# Patient Record
Sex: Male | Born: 1995 | Race: Black or African American | Hispanic: No | Marital: Single | Smoking: Former smoker
Health system: Southern US, Community
[De-identification: ages and names within clinical notes are randomized; demographics above are authoritative.]

## PROBLEM LIST (undated history)

## (undated) DIAGNOSIS — J45909 Unspecified asthma, uncomplicated: Secondary | ICD-10-CM

## (undated) HISTORY — PX: ORBITAL FRACTURE SURGERY: SHX725

---
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
## 2018-03-03 ENCOUNTER — Ambulatory Visit
Admission: RE | Admit: 2018-03-03 | Discharge: 2018-03-03 | Disposition: A | Discharge status: Home / Self Care | Payer: Managed Care, Other (non HMO) | Source: Ambulatory Visit | Attending: Orthopedic Surgery | Admitting: Orthopedic Surgery

## 2018-03-03 ENCOUNTER — Ambulatory Visit: Payer: Managed Care, Other (non HMO) | Admitting: Anesthesiology

## 2018-03-03 ENCOUNTER — Encounter
Admission: RE | Disposition: A | Discharge status: Home / Self Care | Payer: Self-pay | Source: Ambulatory Visit | Attending: Orthopedic Surgery

## 2018-03-03 ENCOUNTER — Encounter: Payer: Self-pay | Admitting: Anesthesiology

## 2018-03-03 DIAGNOSIS — J45909 Unspecified asthma, uncomplicated: Secondary | ICD-10-CM | POA: Diagnosis not present

## 2018-03-03 DIAGNOSIS — Z880 Allergy status to penicillin: Secondary | ICD-10-CM | POA: Diagnosis not present

## 2018-03-03 DIAGNOSIS — S62614A Displaced fracture of proximal phalanx of right ring finger, initial encounter for closed fracture: Secondary | ICD-10-CM | POA: Diagnosis present

## 2018-03-03 DIAGNOSIS — W230XXA Caught, crushed, jammed, or pinched between moving objects, initial encounter: Secondary | ICD-10-CM | POA: Insufficient documentation

## 2018-03-03 DIAGNOSIS — Y998 Other external cause status: Secondary | ICD-10-CM | POA: Insufficient documentation

## 2018-03-03 DIAGNOSIS — Y93H2 Activity, gardening and landscaping: Secondary | ICD-10-CM | POA: Insufficient documentation

## 2018-03-03 DIAGNOSIS — Z91013 Allergy to seafood: Secondary | ICD-10-CM | POA: Insufficient documentation

## 2018-03-03 HISTORY — PX: OPEN REDUCTION INTERNAL FIXATION (ORIF) PROXIMAL PHALANX: SHX6235

## 2018-03-03 HISTORY — DX: Unspecified asthma, uncomplicated: J45.909

## 2018-03-03 SURGERY — OPEN REDUCTION INTERNAL FIXATION (ORIF) PROXIMAL PHALANX
Anesthesia: General | Site: Finger | Laterality: Right | Wound class: "Clean "

## 2018-03-03 MED ORDER — FENTANYL CITRATE (PF) 100 MCG/2ML IJ SOLN
INTRAMUSCULAR | Status: AC
  Filled 2018-03-03: qty 2

## 2018-03-03 MED ORDER — METOCLOPRAMIDE HCL 10 MG PO TABS: 5.0000 mg | ORAL_TABLET | Freq: Three times a day (TID) | ORAL | Status: DC | PRN

## 2018-03-03 MED ORDER — NEOMYCIN-POLYMYXIN B GU 40-200000 IR SOLN
Status: DC | PRN
  Administered 2018-03-03: 2 mL

## 2018-03-03 MED ORDER — HYDROCODONE-ACETAMINOPHEN 5-325 MG PO TABS: 1.0000 | ORAL_TABLET | Freq: Four times a day (QID) | ORAL | 0 refills | Status: AC | PRN

## 2018-03-03 MED ORDER — ACETAMINOPHEN 10 MG/ML IV SOLN
INTRAVENOUS | Status: DC | PRN
  Administered 2018-03-03: 1000 mg via INTRAVENOUS

## 2018-03-03 MED ORDER — ACETAMINOPHEN 10 MG/ML IV SOLN
INTRAVENOUS | Status: AC
  Filled 2018-03-03: qty 100

## 2018-03-03 MED ORDER — FENTANYL CITRATE (PF) 100 MCG/2ML IJ SOLN: 25.0000 ug | INTRAMUSCULAR | Status: DC | PRN

## 2018-03-03 MED ORDER — ONDANSETRON HCL 4 MG PO TABS: 4.0000 mg | ORAL_TABLET | Freq: Four times a day (QID) | ORAL | Status: DC | PRN

## 2018-03-03 MED ORDER — LIDOCAINE HCL (CARDIAC) PF 100 MG/5ML IV SOSY
PREFILLED_SYRINGE | INTRAVENOUS | Status: DC | PRN
  Administered 2018-03-03: 100 mg via INTRAVENOUS

## 2018-03-03 MED ORDER — METOCLOPRAMIDE HCL 5 MG/ML IJ SOLN: 5.0000 mg | Freq: Three times a day (TID) | INTRAMUSCULAR | Status: DC | PRN

## 2018-03-03 MED ORDER — MIDAZOLAM HCL 2 MG/2ML IJ SOLN
INTRAMUSCULAR | Status: DC | PRN
  Administered 2018-03-03: 2 mg via INTRAVENOUS

## 2018-03-03 MED ORDER — CLINDAMYCIN PHOSPHATE 600 MG/50ML IV SOLN
INTRAVENOUS | Status: AC
  Filled 2018-03-03: qty 50

## 2018-03-03 MED ORDER — ONDANSETRON HCL 4 MG/2ML IJ SOLN: 4.0000 mg | Freq: Four times a day (QID) | INTRAMUSCULAR | Status: DC | PRN

## 2018-03-03 MED ORDER — ONDANSETRON HCL 4 MG/2ML IJ SOLN
INTRAMUSCULAR | Status: DC | PRN
  Administered 2018-03-03: 4 mg via INTRAVENOUS

## 2018-03-03 MED ORDER — SODIUM CHLORIDE 0.9 % IV SOLN: INTRAVENOUS | Status: DC

## 2018-03-03 MED ORDER — PHENYLEPHRINE HCL 10 MG/ML IJ SOLN
INTRAMUSCULAR | Status: DC | PRN
  Administered 2018-03-03 (×3): 100 ug via INTRAVENOUS
  Administered 2018-03-03: 200 ug via INTRAVENOUS

## 2018-03-03 MED ORDER — HYDROCODONE-ACETAMINOPHEN 5-325 MG PO TABS: 1.0000 | ORAL_TABLET | ORAL | Status: DC | PRN

## 2018-03-03 MED ORDER — FENTANYL CITRATE (PF) 100 MCG/2ML IJ SOLN
INTRAMUSCULAR | Status: DC | PRN
  Administered 2018-03-03 (×3): 50 ug via INTRAVENOUS

## 2018-03-03 MED ORDER — ONDANSETRON HCL 4 MG/2ML IJ SOLN: 4.0000 mg | Freq: Once | INTRAMUSCULAR | Status: DC | PRN

## 2018-03-03 MED ORDER — MIDAZOLAM HCL 2 MG/2ML IJ SOLN
INTRAMUSCULAR | Status: AC
  Filled 2018-03-03: qty 2

## 2018-03-03 MED ORDER — PROPOFOL 10 MG/ML IV BOLUS
INTRAVENOUS | Status: DC | PRN
  Administered 2018-03-03: 200 mg via INTRAVENOUS

## 2018-03-03 MED ORDER — DEXAMETHASONE SODIUM PHOSPHATE 10 MG/ML IJ SOLN
INTRAMUSCULAR | Status: DC | PRN
  Administered 2018-03-03: 10 mg via INTRAVENOUS

## 2018-03-03 MED ORDER — CLINDAMYCIN PHOSPHATE 600 MG/50ML IV SOLN
600.0000 mg | Freq: Once | INTRAVENOUS | Status: AC
  Administered 2018-03-03: 600 mg via INTRAVENOUS

## 2018-03-03 MED ORDER — PROPOFOL 10 MG/ML IV BOLUS
INTRAVENOUS | Status: AC
  Filled 2018-03-03: qty 20

## 2018-03-03 MED ORDER — LACTATED RINGERS IV SOLN
INTRAVENOUS | Status: DC | PRN
  Administered 2018-03-03 (×2): via INTRAVENOUS

## 2018-03-03 SURGICAL SUPPLY — 42 items
BANDAGE ELASTIC 4 LF NS (GAUZE/BANDAGES/DRESSINGS) ×3 IMPLANT
BIT DRILL 1.1 (BIT) ×1
BIT DRILL 1.1MM (BIT) ×1
BIT DRILL 60X20X1.1XQC TMX (BIT) IMPLANT
BIT DRL 60X20X1.1XQC TMX (BIT) ×1
BNDG GAUZE 1X2.1 STRL (MISCELLANEOUS) ×3 IMPLANT
CHLORAPREP W/TINT 26ML (MISCELLANEOUS) ×3 IMPLANT
CUFF TOURN 18 STER (MISCELLANEOUS) IMPLANT
DRAPE FLUOR MINI C-ARM 54X84 (DRAPES) ×3 IMPLANT
ELECT CAUTERY BLADE 6.4 (BLADE) ×3 IMPLANT
ELECT REM PT RETURN 9FT ADLT (ELECTROSURGICAL) ×3
ELECTRODE REM PT RTRN 9FT ADLT (ELECTROSURGICAL) ×1 IMPLANT
GAUZE PETRO XEROFOAM 1X8 (MISCELLANEOUS) ×3 IMPLANT
GAUZE SPONGE 4X4 12PLY STRL (GAUZE/BANDAGES/DRESSINGS) ×3 IMPLANT
GLOVE SURG SYN 9.0  PF PI (GLOVE) ×2
GLOVE SURG SYN 9.0 PF PI (GLOVE) ×1 IMPLANT
GOWN SRG 2XL LVL 4 RGLN SLV (GOWNS) ×1 IMPLANT
GOWN STRL NON-REIN 2XL LVL4 (GOWNS) ×2
GOWN STRL REUS W/ TWL LRG LVL3 (GOWN DISPOSABLE) ×1 IMPLANT
GOWN STRL REUS W/TWL LRG LVL3 (GOWN DISPOSABLE) ×2
K-WIRE .045X6 DBL TRO NS (WIRE) ×3
KIT TURNOVER KIT A (KITS) ×3 IMPLANT
KWIRE .045X6 DBL TRO NS (WIRE) IMPLANT
LOCK SCREW 1.5X15MM (Screw) ×3 IMPLANT
NDL FILTER BLUNT 18X1 1/2 (NEEDLE) ×1 IMPLANT
NEEDLE FILTER BLUNT 18X 1/2SAF (NEEDLE) ×2
NEEDLE FILTER BLUNT 18X1 1/2 (NEEDLE) ×1 IMPLANT
NS IRRIG 500ML POUR BTL (IV SOLUTION) ×3 IMPLANT
PACK EXTREMITY ARMC (MISCELLANEOUS) ×3 IMPLANT
PAD PREP 24X41 OB/GYN DISP (PERSONAL CARE ITEMS) ×3 IMPLANT
PADDING CAST BLEND 4X4 NS (MISCELLANEOUS) ×3 IMPLANT
PLATE T SMALL 1.5MM (Plate) ×2 IMPLANT
SCREW 1.5X8 (Screw) ×2 IMPLANT
SCREW L 1.5X14 (Screw) ×2 IMPLANT
SCREW LOCK 1.5X15MM (Screw) IMPLANT
SCREW LOCKING 1.5X10 (Screw) ×4 IMPLANT
SCREW LOCKING 1.5X11MM (Screw) ×2 IMPLANT
SCREW NL 1.5X11 WRIST (Screw) ×2 IMPLANT
SCREW NL 1.5X12 (Screw) IMPLANT
SCREW NL 1.5X13 (Screw) IMPLANT
SUT ETHIBOND 4-0 (SUTURE) ×3 IMPLANT
SUT ETHILON 5 0 CL P 3 (SUTURE) ×3 IMPLANT

## 2018-03-03 NOTE — Anesthesia Post-op Follow-up Note (Signed)
Anesthesia QCDR form completed.        

## 2018-03-03 NOTE — Op Note (Signed)
03/03/2018  1:02 PM  PATIENT:  Cody Boone  22 y.o. male  PRE-OPERATIVE DIAGNOSIS:  closed displaced fracture of proximal phalanx of right ring finger  POST-OPERATIVE DIAGNOSIS:  closed displaced fracture of proximal phalanx of right ring finger  PROCEDURE:  Procedure(s): OPEN REDUCTION INTERNAL FIXATION (ORIF) PROXIMAL PHALANX-RIGHT RING FINGER (Right)  SURGEON: Leitha Schuller, MD  ASSISTANTS: None  ANESTHESIA:   general  EBL:  No intake/output data recorded.  BLOOD ADMINISTERED:none  DRAINS: none   LOCAL MEDICATIONS USED:  MARCAINE     SPECIMEN:  No Specimen  DISPOSITION OF SPECIMEN:  N/A  COUNTS:  YES  TOURNIQUET: 80 minutes at 250 mmHg  IMPLANTS: Biomet 1.5 mm small T plate with multiple screws  DICTATION: .Dragon Dictation patient brought the operating room and after adequate anesthesia was obtained the right arm was prepped and draped you sterile fashion after patient identification and timeout procedure were completed after having prepped and draped a tourniquet was raised.  Incision was made along the ileal  side of the proximal phalanx with care taken to try to preserve dorsal nerves as much as possible.  The lateral band was incised so that the proximal foot could be exposed the reduction was somewhat difficult secondary to comminution on the ulnar side.  After he was adequately aligned a K wire was used to provisionally hold it and then the T plate was applied with proximal and distal nonlocking screws to hold the plate against the bone.  Next locking screws were placed proximally distally as well as nonlocking screws and after screw holes were filled except for one at the level of the fracture the fracture appeared stable and essentially anatomic alignment with no rotatory deformity.  After this was completed the wound was thoroughly irrigated and 10 cc of half percent Sensorcaine were used for a digital block to aid in postop analgesia Xeroform 4 x 4's web roll  and a ulnar gutter splint with the MCP joints in flexion was then applied tourniquet let down at the close of the case  PLAN OF CARE: Discharge to home after PACU  PATIENT DISPOSITION:  PACU - hemodynamically stable.

## 2018-03-03 NOTE — Anesthesia Preprocedure Evaluation (Signed)
Anesthesia Evaluation  Patient identified by MRN, date of birth, ID band Patient awake    Reviewed: Allergy & Precautions, NPO status , Patient's Chart, lab work & pertinent test results, reviewed documented beta blocker date and time   Airway Mallampati: II  TM Distance: >3 FB     Dental  (+) Chipped   Pulmonary           Cardiovascular      Neuro/Psych    GI/Hepatic   Endo/Other    Renal/GU      Musculoskeletal   Abdominal   Peds  Hematology   Anesthesia Other Findings   Reproductive/Obstetrics                             Anesthesia Physical Anesthesia Plan  ASA: II  Anesthesia Plan: General   Post-op Pain Management:    Induction: Intravenous  PONV Risk Score and Plan:   Airway Management Planned: LMA  Additional Equipment:   Intra-op Plan:   Post-operative Plan:   Informed Consent: I have reviewed the patients History and Physical, chart, labs and discussed the procedure including the risks, benefits and alternatives for the proposed anesthesia with the patient or authorized representative who has indicated his/her understanding and acceptance.       Plan Discussed with: CRNA  Anesthesia Plan Comments:         Anesthesia Quick Evaluation  

## 2018-03-03 NOTE — Transfer of Care (Signed)
Immediate Anesthesia Transfer of Care Note  Patient: Cody Boone  Procedure(s) Performed: OPEN REDUCTION INTERNAL FIXATION (ORIF) PROXIMAL PHALANX-RIGHT RING FINGER (Right Finger)  Patient Location: PACU  Anesthesia Type:General  Level of Consciousness: sedated  Airway & Oxygen Therapy: Patient Spontanous Breathing and Patient connected to face mask oxygen  Post-op Assessment: Report given to RN and Post -op Vital signs reviewed and stable  Post vital signs: Reviewed and stable  Last Vitals:  Vitals Value Taken Time  BP    Temp    Pulse 72 03/03/2018  1:04 PM  Resp 17 03/03/2018  1:04 PM  SpO2 100 % 03/03/2018  1:04 PM  Vitals shown include unvalidated device data.  Last Pain:  Vitals:   03/03/18 0939  TempSrc: Temporal  PainSc: 0-No pain         Complications: No apparent anesthesia complications

## 2018-03-03 NOTE — Anesthesia Postprocedure Evaluation (Signed)
Anesthesia Post Note  Patient: Cody Boone  Procedure(s) Performed: OPEN REDUCTION INTERNAL FIXATION (ORIF) PROXIMAL PHALANX-RIGHT RING FINGER (Right Finger)  Patient location during evaluation: PACU Anesthesia Type: General Level of consciousness: awake and alert Pain management: pain level controlled Vital Signs Assessment: post-procedure vital signs reviewed and stable Respiratory status: spontaneous breathing, nonlabored ventilation, respiratory function stable and patient connected to nasal cannula oxygen Cardiovascular status: blood pressure returned to baseline and stable Postop Assessment: no apparent nausea or vomiting Anesthetic complications: no     Last Vitals:  Vitals:   03/03/18 1335 03/03/18 1349  BP: 121/88 (!) 131/93  Pulse: 81 80  Resp: 13 15  Temp: (!) 36.1 C 36.6 C  SpO2: 96% 99%    Last Pain:  Vitals:   03/03/18 1349  TempSrc: Temporal  PainSc: 0-No pain                 Kashlynn Kundert S

## 2018-03-03 NOTE — H&P (Signed)
Reviewed paper H+P, will be scanned into chart. No changes noted.  

## 2018-03-03 NOTE — Anesthesia Procedure Notes (Signed)
Procedure Name: LMA Insertion Date/Time: 03/03/2018 11:37 AM Performed by: Junious Silk, CRNA Pre-anesthesia Checklist: Patient identified, Patient being monitored, Timeout performed, Emergency Drugs available and Suction available Patient Re-evaluated:Patient Re-evaluated prior to induction Oxygen Delivery Method: Circle system utilized Preoxygenation: Pre-oxygenation with 100% oxygen Induction Type: IV induction Ventilation: Mask ventilation without difficulty LMA: LMA inserted Tube type: Oral Number of attempts: 1 Placement Confirmation: positive ETCO2 and breath sounds checked- equal and bilateral Tube secured with: Tape Dental Injury: Teeth and Oropharynx as per pre-operative assessment

## 2018-03-03 NOTE — Discharge Instructions (Addendum)
Arm elevated as much as possible through the weekend to minimize swelling.  Pain medicine as directed.  Ice to back of hand today and tomorrow  AMBULATORY SURGERY  DISCHARGE INSTRUCTIONS   1) The drugs that you were given will stay in your system until tomorrow so for the next 24 hours you should not:  A) Drive an automobile B) Make any legal decisions C) Drink any alcoholic beverage   2) You may resume regular meals tomorrow.  Today it is better to start with liquids and gradually work up to solid foods.  You may eat anything you prefer, but it is better to start with liquids, then soup and crackers, and gradually work up to solid foods.   3) Please notify your doctor immediately if you have any unusual bleeding, trouble breathing, redness and pain at the surgery site, drainage, fever, or pain not relieved by medication.    4) Additional Instructions:        Please contact your physician with any problems or Same Day Surgery at [phone], Monday through Friday 6 am to 4 pm, or [place] [facility] number at [phone].

## 2018-04-12 ENCOUNTER — Other Ambulatory Visit: Payer: Self-pay

## 2018-04-12 ENCOUNTER — Encounter: Payer: Self-pay | Admitting: Occupational Therapy

## 2018-04-12 ENCOUNTER — Ambulatory Visit: Payer: Managed Care, Other (non HMO) | Attending: Orthopedic Surgery | Admitting: Occupational Therapy

## 2018-04-12 DIAGNOSIS — M6281 Muscle weakness (generalized): Secondary | ICD-10-CM | POA: Diagnosis present

## 2018-04-12 DIAGNOSIS — M25641 Stiffness of right hand, not elsewhere classified: Secondary | ICD-10-CM | POA: Diagnosis present

## 2018-04-12 DIAGNOSIS — R278 Other lack of coordination: Secondary | ICD-10-CM | POA: Diagnosis present

## 2018-04-12 DIAGNOSIS — L905 Scar conditions and fibrosis of skin: Secondary | ICD-10-CM | POA: Diagnosis present

## 2018-04-15 NOTE — Therapy (Signed)
[place] [facility] AND [facility]TS MEDICINE [address], [place], [address] Phone: [phone]   Fax:  [phone]  Occupational Therapy Evaluation  Patient Details  Name: Cody Boone MRN: [id number] Date of Birth: 20-Jun-1996 Referring Provider: Rosita Kea   Encounter Date: 04/12/2018  OT End of Session - 04/15/18 1050    Visit Number  1    Number of Visits  12    Date for OT Re-Evaluation  05/24/18    OT Start Time  1500    OT Stop Time  1550    OT Time Calculation (min)  50 min    Activity Tolerance  Patient tolerated treatment well    Behavior During Therapy  [facility] for tasks assessed/performed       Past Medical History:  Diagnosis Date  . Asthma     Past Surgical History:  Procedure Laterality Date  . OPEN REDUCTION INTERNAL FIXATION (ORIF) PROXIMAL PHALANX Right 03/03/2018   Procedure: OPEN REDUCTION INTERNAL FIXATION (ORIF) PROXIMAL PHALANX-RIGHT RING FINGER;  Surgeon: Kennedy Bucker, MD;  Location: [facility] ORS;  Service: Orthopedics;  Laterality: Right;  . ORBITAL FRACTURE SURGERY Left     There were no vitals filed for this visit.  Subjective Assessment - 04/15/18 1049    Subjective   Patient denies any pain this date at evaluation.   He is a Archivist out for the summer.      Pertinent History  Pt is a 22 yo male who suffered an injury on 02/24/2018 when he was riding on a lawnmower that started to turn over, he jammed his right ring finger as he fell.  He was diagnosed with Closed displaced fracture of proximal phalanx of right ring finger is s/p OPEN REDUCTION INTERNAL FIXATION (ORIF) PROXIMAL PHALANX-RIGHT RING FINGER (Right) on 03/03/2018.      Limitations  no heavy lifting     Patient Stated Goals  Pt would like to use his hand more for everyday tasks.    Currently in Pain?  No/denies    Multiple Pain Sites  No        [facility] OT Assessment - 04/15/18 1055      Assessment   Medical Diagnosis  Closed displaced fracture  of proximal phalanx of right ring finger is s/p OPEN REDUCTION INTERNAL FIXATION (ORIF) PROXIMAL PHALANX-RIGHT RING FINGER (Right) on 03/03/2018.     Referring Provider  Rosita Kea    Hand Dominance  Left      Balance Screen   Has the patient fallen in the past 6 months  No    Has the patient had a decrease in activity level because of a fear of falling?   No    Is the patient reluctant to leave their home because of a fear of falling?   No      Home  Environment   Family/patient expects to be discharged to:  Private residence    Type of Home  House    Home Layout  One level    Lives With  Family      Prior Function   Level of Independence  Independent    Vocation  Student    Vocation Requirements  full time student at [employer]     Leisure  likes to hunt and fish      ADL   ADL comments  Patient is independent with all basic self care tasks, IADLs, driving.        Written Expression  Dominant Hand  Left      Sensation   Light Touch  Appears Intact    Stereognosis  Appears Intact    Hot/Cold  Appears Intact    Proprioception  Appears Intact      Coordination   Gross Motor Movements are Fluid and Coordinated  Yes    Fine Motor Movements are Fluid and Coordinated  Yes      Edema   Edema  edema present in right ring finger      AROM   Overall AROM Comments  BUE shoulder ROM WNL, elbow and wrist WNL      Strength   Overall Strength Comments  BUE shoulder, elbow and wrist strength 5/5       Right Hand AROM   R Index  MCP 0-90  90 Degrees    R Index PIP 0-100  95 Degrees    R Index DIP 0-70  75 Degrees    R Long  MCP 0-90  90 Degrees    R Long PIP 0-100  90 Degrees    R Long DIP 0-70  85 Degrees    R Ring  MCP 0-90  75 Degrees    R Ring PIP 0-100  75 Degrees -40 degrees extension    R Ring DIP 0-70  70 Degrees    R Little  MCP 0-90  65 Degrees    R Little PIP 0-100  90 Degrees    R Little DIP 0-70  75 Degrees      Right Hand PROM   R Index  MCP 0-90  90 Degrees    R  Index PIP 0-100  90 Degrees    R Index DIP 0-70  85 Degrees    R Long  MCP 0-90  85 Degrees    R Long PIP 0-100  90 Degrees    R Long DIP 0-70  85 Degrees    R Ring  MCP 0-90  90 Degrees    R Ring PIP 0-100  90 Degrees    R Ring DIP 0-70  80 Degrees    R Little  MCP 0-90  85 Degrees    R Little PIP 0-100  90 Degrees    R Little DIP 0-70  85 Degrees      Hand Function   Right Hand Grip (lbs)  60    Right Hand Lateral Pinch  18 lbs    Right Hand 3 Point Pinch  16 lbs    Left Hand Grip (lbs)  90    Left Hand Lateral Pinch  18 lbs    Left 3 point pinch  14 lbs        Right wrist flexion 80 degrees, extension 70. Fisting on right-partial fist at eval, 2 cm from ring finger from palm, .5 cm SF from palm.  He does not have a splint  Sensation intact Denies pain  Tx: Patient seen for contrast for edema control for 11 mins, alternating warm/cold Instructed on home program for ROM PIP extension ring finger right Tendon gliding exercises MP extension MP flexion Blocked DIP, PIP flexion All exercises 10 reps for 2-3 times a day                OT Education - 04/15/18 1050    Education Details  HEP, contrast for edema    Person(s) Educated  Patient    Methods  Explanation    Comprehension  Verbalized understanding;Returned demonstration  OT Long Term Goals - 04/15/18 1105      OT LONG TERM GOAL #1   Title  Patient will be independent with home exercise program for ROM, strength.     Baseline  none    Time  6    Period  Weeks    Status  New    Target Date  05/24/18      OT LONG TERM GOAL #2   Title  Patient will demonstrate understanding and implementation of edema control measures.     Baseline  none at eval    Time  3    Period  Weeks    Status  New    Target Date  05/03/18      OT LONG TERM GOAL #3   Title  Patient will demonstrate improved right ring finger extension for functional hand activities.     Baseline  -40 degrees extension  at eval    Time  6    Period  Weeks    Status  New    Target Date  05/24/18      OT LONG TERM GOAL #4   Title  Patient will demonstrate full fisting with fingers to palm and improved grip strength by 15# to pick up and grasp objects    Baseline  60# grip right, 90# left at eval, lacks full fist to palm at eval    Time  6    Period  Weeks    Status  New    Target Date  05/24/18            Plan - 04/15/18 1052    Clinical Impression Statement  Pt is a 22 yo male who suffered an injury on 02/24/2018 when he was riding on a lawnmower that started to turn over, he jammed his right ring finger as he fell.  He was diagnosed with Closed displaced fracture of proximal phalanx of right ring finger is s/p OPEN REDUCTION INTERNAL FIXATION (ORIF) PROXIMAL PHALANX-RIGHT RING FINGER (Right) on 03/03/2018.  He is left hand dominant and was referred to OT for evaluation and treatment.  Patient presents with right finger and hand muscle weakness, decreased ROM, lacks full extension of right ring PIP, edema, decreased grip strength and tenderness along scar line.  He would benefit from skilled OT to address above limitations and improve right hand use for necessary daily tasks.  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     Occupational performance deficits (Please refer to evaluation for details):  ADL's;IADL's;Education;Leisure    Rehab Potential  Excellent    Current Impairments/barriers affecting progress:  lack of extension right ring finger    OT Frequency  2x / week    OT Duration  6 weeks    OT Treatment/Interventions  Self-care/ADL training;Therapeutic exercise;Moist Heat;Paraffin;Neuromuscular education;Splinting;Patient/family education;Fluidtherapy;Scar mobilization;Therapeutic activities;Ultrasound;Cryotherapy;Contrast Bath;DME and/or AE instruction;Manual Therapy;Passive range of motion    Clinical Decision Making  Limited treatment options, no task modification  necessary    Consulted and Agree with Plan of Care  Patient       Patient will benefit from skilled therapeutic intervention in order to improve the following deficits and impairments:  Increased edema, Impaired flexibility, Decreased coordination, Decreased scar mobility, Decreased range of motion, Decreased strength, Impaired UE functional use  Visit Diagnosis: Stiffness of right hand, not elsewhere classified  Muscle weakness (generalized)  Scar condition and fibrosis of skin  Other lack of coordination  Problem List There are no active problems to display for this patient.  Amy T Arne ClevelandLovett, OTR/L, CLT  Lovett,Amy 04/15/2018, 11:10 AM  [facility] PH[facility] MEDICINE [address] [place], [place], [address] Phone: [phone]   Fax:  [phone]  Name: Cody Boone MRN: [id number] Date of Birth: 1996-07-16

## 2018-04-18 ENCOUNTER — Ambulatory Visit: Payer: Managed Care, Other (non HMO) | Admitting: Occupational Therapy

## 2018-04-18 DIAGNOSIS — M25641 Stiffness of right hand, not elsewhere classified: Secondary | ICD-10-CM

## 2018-04-18 DIAGNOSIS — R278 Other lack of coordination: Secondary | ICD-10-CM

## 2018-04-18 DIAGNOSIS — M6281 Muscle weakness (generalized): Secondary | ICD-10-CM

## 2018-04-18 DIAGNOSIS — L905 Scar conditions and fibrosis of skin: Secondary | ICD-10-CM

## 2018-04-18 NOTE — Therapy (Signed)
[facility]IC[facility] [address] [place], [place], [address] Phone: [phone]   Fax:  [phone]  Occupational Therapy Treatment  Patient Details  Name: Cody Boone K Kemp MRN: [id number] Date of Birth: 07-Nov-1995 Referring Provider: Rosita KeaMenz   Encounter Date: 04/18/2018  OT End of Session - 04/18/18 1120    Visit Number  2    Number of Visits  12    Date for OT Re-Evaluation  05/24/18    OT Start Time  1038    OT Stop Time  1115    OT Time Calculation (min)  37 min    Activity Tolerance  Patient tolerated treatment well    Behavior During Therapy  [facility] for tasks assessed/performed       Past Medical History:  Diagnosis Date  . Asthma     Past Surgical History:  Procedure Laterality Date  . OPEN REDUCTION INTERNAL FIXATION (ORIF) PROXIMAL PHALANX Right 03/03/2018   Procedure: OPEN REDUCTION INTERNAL FIXATION (ORIF) PROXIMAL PHALANX-RIGHT RING FINGER;  Surgeon: Kennedy BuckerMenz, Michael, MD;  Location: [facility] ORS;  Service: Orthopedics;  Laterality: Right;  . ORBITAL FRACTURE SURGERY Left     There were no vitals filed for this visit.  Subjective Assessment - 04/18/18 1117    Subjective   No pain - did my exercises about 3 x day     Patient Stated Goals  Pt would like to use his hand more for everyday tasks.    Currently in Pain?  No/denies         [facility] Assessment - 04/18/18 0001      Right Hand AROM   R Ring  MCP 0-90  85 Degrees    R Ring PIP 0-100  90 Degrees -45    R Ring DIP 0-70  65 Degrees       edema increase proximal phalanges 4th digit  7.3 cm R , L 5.8 and PIP increase 0.4 cm  Scar assess -still adhere on lateral digit  Pt ed on scar massage and mobs done by OT - using graston tool nr 2 for brushing and coban  Fitted with  Silicon digi sleeve for use at night time - and as needed during day   Ed on use of LMB splint for PIP extention of 4th  Precautions and pt verbalize and demo understanding          OT Treatments/Exercises (OP) - 04/18/18 0001      RUE Paraffin   Number Minutes Paraffin  8 Minutes    RUE Paraffin Location  Hand    Comments  LMB splint on for PIP extention on 4th  PIP         LMB splint on for 5-10 in slight stretch at home PROM for extention of PIP on table and during  PROM PIP extention with MP flex at 90 degrees  Rolling of putty 20 reps for PIP extention  - teal - NO GRIPPING Block MC - rubberband for extention of PIP - 10 reps( around 4th and 5th only    5 x day  Tendon glides - for flexion 3 x during day at least        OT Education - 04/18/18 1120    Education Details  update on HEP     Person(s) Educated  Patient    Methods  Explanation;Demonstration;Tactile cues    Comprehension  Returned demonstration;Verbal cues required;Verbalized understanding          OT  Long Term Goals - 04/15/18 1105      OT LONG TERM GOAL #1   Title  Patient will be independent with home exercise program for ROM, strength.     Baseline  none    Time  6    Period  Weeks    Status  New    Target Date  05/24/18      OT LONG TERM GOAL #2   Title  Patient will demonstrate understanding and implementation of edema control measures.     Baseline  none at eval    Time  3    Period  Weeks    Status  New    Target Date  05/03/18      OT LONG TERM GOAL #3   Title  Patient will demonstrate improved right ring finger extension for functional hand activities.     Baseline  -40 degrees extension at eval    Time  6    Period  Weeks    Status  New    Target Date  05/24/18      OT LONG TERM GOAL #4   Title  Patient will demonstrate full fisting with fingers to palm and improved grip strength by 15# to pick up and grasp objects    Baseline  60# grip right, 90# left at eval, lacks full fist to palm at eval    Time  6    Period  Weeks    Status  New    Target Date  05/24/18            Plan - 04/18/18 1120    Clinical Impression Statement  Pt  is 6 1/2 wk s/p ORIF proximal phalanges fx of R 4th - denies pain -and show increase flexion at PIP and MC - but extention of PIP still limited by -45 degrees- in clinic this date  improve to -30 degrees- update his HEP for PIP extention - and LMB splint use -     Occupational performance deficits (Please refer to evaluation for details):  ADL's;IADL's;Education;Leisure    Rehab Potential  Excellent    Current Impairments/barriers affecting progress:  lack of extension right ring finger    OT Frequency  2x / week    OT Duration  6 weeks    OT Treatment/Interventions  Self-care/ADL training;Therapeutic exercise;Moist Heat;Paraffin;Neuromuscular education;Splinting;Patient/family education;Fluidtherapy;Scar mobilization;Therapeutic activities;Ultrasound;Cryotherapy;Contrast Bath;DME and/or AE instruction;Manual Therapy;Passive range of motion    Clinical Decision Making  Limited treatment options, no task modification necessary    Consulted and Agree with Plan of Care  Patient       Patient will benefit from skilled therapeutic intervention in order to improve the following deficits and impairments:  Increased edema, Impaired flexibility, Decreased coordination, Decreased scar mobility, Decreased range of motion, Decreased strength, Impaired UE functional use  Visit Diagnosis: Stiffness of right hand, not elsewhere classified  Muscle weakness (generalized)  Scar condition and fibrosis of skin  Other lack of coordination    Problem List There are no active problems to display for this patient.   Oletta Cohn OTR/L,CLT 04/18/2018, 11:23 AM  [facility] PHYSICAL AND SPORTS MEDICINE [address], [place], [address] Phone: [phone]   Fax:  [phone]  Name: Cody Boone MRN: [id number] Date of Birth: 1996/09/11

## 2018-04-18 NOTE — Patient Instructions (Signed)
Heat  LMB splint on for 5-10 in slight stretch Scar massage  PROM for extention of PIP  Rolling of putty 20 reps for PIP extention  Block MC - rubberband for extention of PIP - 10 reps   5 x day  Tendon glides - for flexion 3 x during day at least   Silicon digi sleeve for night time - as needed during day for edema

## 2018-04-22 ENCOUNTER — Ambulatory Visit: Payer: Managed Care, Other (non HMO) | Admitting: Occupational Therapy

## 2018-04-22 DIAGNOSIS — M25641 Stiffness of right hand, not elsewhere classified: Secondary | ICD-10-CM | POA: Diagnosis not present

## 2018-04-22 DIAGNOSIS — M6281 Muscle weakness (generalized): Secondary | ICD-10-CM

## 2018-04-22 DIAGNOSIS — L905 Scar conditions and fibrosis of skin: Secondary | ICD-10-CM

## 2018-04-22 DIAGNOSIS — R278 Other lack of coordination: Secondary | ICD-10-CM

## 2018-04-22 NOTE — Therapy (Signed)
[facility] [address], [place], [address] Phone: [phone]   Fax:  [phone]  Occupational Therapy Treatment  Patient Details  Name: Cody Boone MRN: [id number] Date of Birth: Jul 04, 1996 Referring Provider: Rosita Kea   Encounter Date: 04/22/2018  OT End of Session - 04/22/18 1135    Visit Number  3    Number of Visits  12    Date for OT Re-Evaluation  05/24/18    OT Start Time  1124    OT Stop Time  1202    OT Time Calculation (min)  38 min    Activity Tolerance  Patient tolerated treatment well    Behavior During Therapy  [facility] for tasks assessed/performed       Past Medical History:  Diagnosis Date  . Asthma     Past Surgical History:  Procedure Laterality Date  . OPEN REDUCTION INTERNAL FIXATION (ORIF) PROXIMAL PHALANX Right 03/03/2018   Procedure: OPEN REDUCTION INTERNAL FIXATION (ORIF) PROXIMAL PHALANX-RIGHT RING FINGER;  Surgeon: Kennedy Bucker, MD;  Location: [facility] ORS;  Service: Orthopedics;  Laterality: Right;  . ORBITAL FRACTURE SURGERY Left     There were no vitals filed for this visit.  Subjective Assessment - 04/22/18 1134    Subjective   DId okay with the exercises -  I can feel my finger can stretch out little more- no increase pain     Patient Stated Goals  Pt would like to use his hand more for everyday tasks.    Currently in Pain?  No/denies          PIP of 4th -35 coming in  End of session -25 degrees          OT Treatments/Exercises (OP) - 04/22/18 0001      RUE Paraffin   Number Minutes Paraffin  8 Minutes    RUE Paraffin Location  Hand    Comments  LMB splint on while in paraffin to increase extention of PIP        edema increase proximal phalanges 4th digit  7.1 cm R , L 5.8 still - pt to pull compression sleeve down more Fitted with  Silicon digi sleeve for use at night time - and as needed during day  Scar assess -still adhere on lateral digit  But  improved from last time   scar massage and mobs done by OT - using graston tool nr 2 for brushing and coban  - and brushing done on volar 4th digit and palm  Gentle mobs and traction to PIP prior to ROM    LMB splint for PIP extention of 4th  On paraffin   LMB splint on for 5-10 in slight stretch at home PROM for extention of PIP on table and during  PROM PIP extention with MP flex at 90 degrees  - mproved  Rolling of putty 20 reps for PIP extention  - teal - NO GRIPPING Block MC - rubberband for extention of PIP - 10 reps( around 4th and 5th only  extention of PIP -35 this date coming in and end of session -25    5 x day  Tendon glides - for flexion 3 x during day at least        OT Education - 04/22/18 1135    Education Details  review again HEP    Person(s) Educated  Patient    Methods  Explanation;Demonstration    Comprehension  Verbalized understanding;Returned demonstration  OT Long Term Goals - 04/15/18 1105      OT LONG TERM GOAL #1   Title  Patient will be independent with home exercise program for ROM, strength.     Baseline  none    Time  6    Period  Weeks    Status  New    Target Date  05/24/18      OT LONG TERM GOAL #2   Title  Patient will demonstrate understanding and implementation of edema control measures.     Baseline  none at eval    Time  3    Period  Weeks    Status  New    Target Date  05/03/18      OT LONG TERM GOAL #3   Title  Patient will demonstrate improved right ring finger extension for functional hand activities.     Baseline  -40 degrees extension at eval    Time  6    Period  Weeks    Status  New    Target Date  05/24/18      OT LONG TERM GOAL #4   Title  Patient will demonstrate full fisting with fingers to palm and improved grip strength by 15# to pick up and grasp objects    Baseline  60# grip right, 90# left at eval, lacks full fist to palm at eval    Time  6    Period  Weeks    Status  New    Target Date   05/24/18            Plan - 04/22/18 1136    Clinical Impression Statement  Pt about 7 wks s/p ORIF prox phalanges fx of R 4th - no pain - pt showed increase PIP extention this date and MC flexoin -pt to cont with same HEP - focus on PIP extnetion     Occupational performance deficits (Please refer to evaluation for details):  ADL's;IADL's;Education;Leisure    Rehab Potential  Excellent    Current Impairments/barriers affecting progress:  lack of extension right ring finger    OT Frequency  2x / week    OT Duration  6 weeks    OT Treatment/Interventions  Self-care/ADL training;Therapeutic exercise;Moist Heat;Paraffin;Neuromuscular education;Splinting;Patient/family education;Fluidtherapy;Scar mobilization;Therapeutic activities;Ultrasound;Cryotherapy;Contrast Bath;DME and/or AE instruction;Manual Therapy;Passive range of motion    Plan  assess progress with HEP and upgrade HEP     Clinical Decision Making  Limited treatment options, no task modification necessary    OT Home Exercise Plan  see pt instruction     Consulted and Agree with Plan of Care  Patient       Patient will benefit from skilled therapeutic intervention in order to improve the following deficits and impairments:  Increased edema, Impaired flexibility, Decreased coordination, Decreased scar mobility, Decreased range of motion, Decreased strength, Impaired UE functional use  Visit Diagnosis: Stiffness of right hand, not elsewhere classified  Muscle weakness (generalized)  Scar condition and fibrosis of skin  Other lack of coordination    Problem List There are no active problems to display for this patient.   Oletta CohnuPreez, Malone Admire OTR/l,CLT 04/22/2018, 12:09 PM  [facility] [facility] [address] [place], [place], [address] Phone: [phone]   Fax:  [phone]  Name: Cody Boone MRN: [id number] Date of Birth: June 28, 1996

## 2018-04-22 NOTE — Patient Instructions (Addendum)
Same HEP  

## 2018-04-25 ENCOUNTER — Ambulatory Visit: Payer: Managed Care, Other (non HMO) | Admitting: Occupational Therapy

## 2018-04-25 DIAGNOSIS — R278 Other lack of coordination: Secondary | ICD-10-CM

## 2018-04-25 DIAGNOSIS — M25641 Stiffness of right hand, not elsewhere classified: Secondary | ICD-10-CM | POA: Diagnosis not present

## 2018-04-25 DIAGNOSIS — M6281 Muscle weakness (generalized): Secondary | ICD-10-CM

## 2018-04-25 DIAGNOSIS — L905 Scar conditions and fibrosis of skin: Secondary | ICD-10-CM

## 2018-04-25 NOTE — Patient Instructions (Signed)
Same HEP - add putty PIP extention - using teal

## 2018-04-25 NOTE — Therapy (Signed)
[facility] [address] [place], [address] Phone: [phone]   Fax:  [phone]  Occupational Therapy Treatment  Patient Details  Name: MATILDE MARKIE MRN: [id number] Date of Birth: 1996/05/22 Referring Provider: Rosita Kea   Encounter Date: 04/25/2018  OT End of Session - 04/25/18 1547    Visit Number  4    Number of Visits  12    Date for OT Re-Evaluation  05/24/18    OT Start Time  1513    OT Stop Time  1544    OT Time Calculation (min)  31 min    Activity Tolerance  Patient tolerated treatment well    Behavior During Therapy  [facility] for tasks assessed/performed       Past Medical History:  Diagnosis Date  . Asthma     Past Surgical History:  Procedure Laterality Date  . OPEN REDUCTION INTERNAL FIXATION (ORIF) PROXIMAL PHALANX Right 03/03/2018   Procedure: OPEN REDUCTION INTERNAL FIXATION (ORIF) PROXIMAL PHALANX-RIGHT RING FINGER;  Surgeon: Kennedy Bucker, MD;  Location: [facility] ORS;  Service: Orthopedics;  Laterality: Right;  . ORBITAL FRACTURE SURGERY Left     There were no vitals filed for this visit.  Subjective Assessment - 04/25/18 1515    Subjective   I can use my hand in everything - put on gloves, reaching in pocket - picking up objects -no really pain - I think my finger is getting more straigtht    Patient Stated Goals  Pt would like to use his hand more for everyday tasks.    Currently in Pain?  No/denies         [facility] OT Assessment - 04/25/18 0001      Strength   Right Hand Grip (lbs)  80    Left Hand Grip (lbs)  95      Right Hand AROM   R Ring  MCP 0-90  88 Degrees    R Ring PIP 0-100  95 Degrees -20 lat, dorsally -30               OT Treatments/Exercises (OP) - 04/25/18 0001      RUE Paraffin   Number Minutes Paraffin  8 Minutes    RUE Paraffin Location  Hand    Comments  LMB splint on for PIP extention of 4th prior to ROM , manual         edema increase  proximal phalanges 4th digit 7.0 cm R , L 5.8 still - pt to pull compression sleeve down more   Scar assess - great improvement in scar adhesion  on lateral digit    scar massage and mobs done by OT - using graston tool nr 2 for brushing and coban  - and brushing done on volar 4th digit and palm  Gentle mobs and traction to PIP prior to ROM    LMB splint for PIP extention of 4th  On paraffin    LMB splint on for 5-10 in slight stretchat home PROM for extention of PIPon table and during PROM PIP extention with MP flex at 90 degrees  - mproved  Rolling of putty 20 reps for PIP extention- teal - NO GRIPPING Block MC - rubberband for extention of PIP - 10 reps( around 4th and 5th only Teal putty - pushing into putty for PIP extention   5 x day  Tendon glides - for flexion 3 x during day at least Grip assess - grip on L  95 and R 80 lbs       OT Education - 04/25/18 1547    Education Details  PIP extention - into teal putty     Person(s) Educated  Patient    Methods  Demonstration    Comprehension  Verbalized understanding;Returned demonstration          OT Long Term Goals - 04/15/18 1105      OT LONG TERM GOAL #1   Title  Patient will be independent with home exercise program for ROM, strength.     Baseline  none    Time  6    Period  Weeks    Status  New    Target Date  05/24/18      OT LONG TERM GOAL #2   Title  Patient will demonstrate understanding and implementation of edema control measures.     Baseline  none at eval    Time  3    Period  Weeks    Status  New    Target Date  05/03/18      OT LONG TERM GOAL #3   Title  Patient will demonstrate improved right ring finger extension for functional hand activities.     Baseline  -40 degrees extension at eval    Time  6    Period  Weeks    Status  New    Target Date  05/24/18      OT LONG TERM GOAL #4   Title  Patient will demonstrate full fisting with fingers to palm and improved grip strength by  15# to pick up and grasp objects    Baseline  60# grip right, 90# left at eval, lacks full fist to palm at eval    Time  6    Period  Weeks    Status  New    Target Date  05/24/18            Plan - 04/25/18 1548    Clinical Impression Statement  Pt is now about 8 wks s/p ORIF prox phalanges fx R 4th - pt cont to show increase PIP extention - -20 on lateral side - and with edema -30 dorsal - show improvement of 5 degrees in session - pt to cont to increase extention     Occupational performance deficits (Please refer to evaluation for details):  ADL's;IADL's;Education;Leisure    Rehab Potential  Excellent    Current Impairments/barriers affecting progress:  lack of extension right ring finger    OT Frequency  2x / week    OT Duration  4 weeks    Plan  assess progress with HEP and upgrade HEP     Clinical Decision Making  Limited treatment options, no task modification necessary    OT Home Exercise Plan  see pt instruction     Consulted and Agree with Plan of Care  Patient       Patient will benefit from skilled therapeutic intervention in order to improve the following deficits and impairments:  Increased edema, Impaired flexibility, Decreased coordination, Decreased scar mobility, Decreased range of motion, Decreased strength, Impaired UE functional use  Visit Diagnosis: Stiffness of right hand, not elsewhere classified  Muscle weakness (generalized)  Scar condition and fibrosis of skin  Other lack of coordination    Problem List There are no active problems to display for this patient.   Oletta Cohn OTR/L,CLT 04/25/2018, 3:51 PM  [place] [facility] PHYSICAL AND SPORTS MEDICINE [address] [place], [place], [address] Phone: [phone]   Fax:  [phone]  Name: Mayford Knifeyson K Marlin MRN: [id number] Date of Birth: 08-18-1996

## 2018-04-28 ENCOUNTER — Ambulatory Visit: Payer: Managed Care, Other (non HMO) | Admitting: Occupational Therapy

## 2018-04-28 DIAGNOSIS — R278 Other lack of coordination: Secondary | ICD-10-CM

## 2018-04-28 DIAGNOSIS — M25641 Stiffness of right hand, not elsewhere classified: Secondary | ICD-10-CM

## 2018-04-28 DIAGNOSIS — M6281 Muscle weakness (generalized): Secondary | ICD-10-CM

## 2018-04-28 DIAGNOSIS — L905 Scar conditions and fibrosis of skin: Secondary | ICD-10-CM

## 2018-04-28 NOTE — Patient Instructions (Signed)
Add to HEP PIP extention gutter splint to wear at night time

## 2018-04-28 NOTE — Therapy (Signed)
[facility] [address] [place], [place], [address] Phone: [phone]   Fax:  [phone]  Occupational Therapy Treatment  Patient Details  Name: Cody Boone MRN: [id number] Date of Birth: [birth year] Referring Provider: Rosita KeaMenz   Encounter Date: 04/28/2018  OT End of Session - 04/28/18 0830    Visit Number  5    Number of Visits  12    Date for OT Re-Evaluation  05/24/18    OT Start Time  0757    OT Stop Time  0825    OT Time Calculation (min)  28 min    Activity Tolerance  Patient tolerated treatment well    Behavior During Therapy  [facility] for tasks assessed/performed       Past Medical History:  Diagnosis Date  . Asthma     Past Surgical History:  Procedure Laterality Date  . OPEN REDUCTION INTERNAL FIXATION (ORIF) PROXIMAL PHALANX Right 03/03/2018   Procedure: OPEN REDUCTION INTERNAL FIXATION (ORIF) PROXIMAL PHALANX-RIGHT RING FINGER;  Surgeon: Kennedy BuckerMenz, Michael, MD;  Location: [facility] ORS;  Service: Orthopedics;  Laterality: Right;  . ORBITAL FRACTURE SURGERY Left     There were no vitals filed for this visit.  Subjective Assessment - 04/28/18 0829    Subjective   Doing okay - I think it is still getting better my finger - more straight     Patient Stated Goals  Pt would like to use his hand more for everyday tasks.    Currently in Pain?  No/denies       Assess AROM of 4th PIP - -15 lat side and -28 on dorsal              OT Treatments/Exercises (OP) - 04/28/18 0001      RUE Paraffin   Number Minutes Paraffin  8 Minutes    RUE Paraffin Location  Hand    Comments  LMB splint on 4th  PIP to increase exte       Scar assess - great improvement in scar adhesion  on lateral digit  scar massage and mobs done by OT - using graston tool nr 2 for brushing and coban - and brushing done on volar 4th digit and palm  Gentle mobs and traction to PIP prior to ROM    LMB splint for PIP extention  of 4th On paraffin   PROM for PIP extention - in composite extention and lumbrical fist  AROM blocked PIP extention  Fabricate extention gutter splint for night time use- at 0 degrees of extention   pt ed on use and precautions - pt to do HEP for week and follow up      OT Education - 04/28/18 0830    Education Details  ed on use of PIP extention gutter splint for night time     Person(s) Educated  Patient    Methods  Demonstration    Comprehension  Verbalized understanding;Returned demonstration          OT Long Term Goals - 04/15/18 1105      OT LONG TERM GOAL #1   Title  Patient will be independent with home exercise program for ROM, strength.     Baseline  none    Time  6    Period  Weeks    Status  New    Target Date  05/24/18      OT LONG TERM GOAL #2   Title  Patient will demonstrate understanding and  implementation of edema control measures.     Baseline  none at eval    Time  3    Period  Weeks    Status  New    Target Date  05/03/18      OT LONG TERM GOAL #3   Title  Patient will demonstrate improved right ring finger extension for functional hand activities.     Baseline  -40 degrees extension at eval    Time  6    Period  Weeks    Status  New    Target Date  05/24/18      OT LONG TERM GOAL #4   Title  Patient will demonstrate full fisting with fingers to palm and improved grip strength by 15# to pick up and grasp objects    Baseline  60# grip right, 90# left at eval, lacks full fist to palm at eval    Time  6    Period  Weeks    Status  New    Target Date  05/24/18            Plan - 04/28/18 0831    Clinical Impression Statement  Pt making progress in scar tissue and AROM in PIP extention of 4th - but since last time on lat side -15 - add this date PIP extention splint to wear at night time - and cont wtih PROM , AROM and strengthening     Occupational performance deficits (Please refer to evaluation for details):   ADL's;IADL's;Education;Leisure    Rehab Potential  Excellent    Current Impairments/barriers affecting progress:  lack of extension right ring finger    OT Frequency  1x / week    OT Duration  4 weeks    OT Treatment/Interventions  Self-care/ADL training;Therapeutic exercise;Moist Heat;Paraffin;Neuromuscular education;Splinting;Patient/family education;Fluidtherapy;Scar mobilization;Therapeutic activities;Ultrasound;Cryotherapy;Contrast Bath;DME and/or AE instruction;Manual Therapy;Passive range of motion    Plan  assess progress with HEP and use of night extention gutter splint     Clinical Decision Making  Limited treatment options, no task modification necessary    OT Home Exercise Plan  see pt instruction     Consulted and Agree with Plan of Care  Patient       Patient will benefit from skilled therapeutic intervention in order to improve the following deficits and impairments:  Increased edema, Impaired flexibility, Decreased coordination, Decreased scar mobility, Decreased range of motion, Decreased strength, Impaired UE functional use  Visit Diagnosis: Stiffness of right hand, not elsewhere classified  Muscle weakness (generalized)  Scar condition and fibrosis of skin  Other lack of coordination    Problem List There are no active problems to display for this patient.   Oletta Cohn OTR/L,CLT 04/28/2018, 8:33 AM  [facility] PHYSICAL AND SPORTS MEDICINE [address] [place], [address] Phone: [phone]   Fax:  [phone]  Name: Cody Boone MRN: [id number] Date of Birth: November 10, 1995

## 2018-05-05 ENCOUNTER — Ambulatory Visit: Payer: Managed Care, Other (non HMO) | Attending: Orthopedic Surgery | Admitting: Occupational Therapy

## 2018-05-05 DIAGNOSIS — R278 Other lack of coordination: Secondary | ICD-10-CM | POA: Insufficient documentation

## 2018-05-05 DIAGNOSIS — L905 Scar conditions and fibrosis of skin: Secondary | ICD-10-CM | POA: Diagnosis present

## 2018-05-05 DIAGNOSIS — M6281 Muscle weakness (generalized): Secondary | ICD-10-CM | POA: Diagnosis present

## 2018-05-05 DIAGNOSIS — M25641 Stiffness of right hand, not elsewhere classified: Secondary | ICD-10-CM | POA: Diagnosis present
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
## 2018-05-05 NOTE — Patient Instructions (Signed)
  Same  Where quick cast for PIP extention most all the time - off 5 x day for maintain flexion in digits

## 2018-05-12 ENCOUNTER — Ambulatory Visit: Payer: Managed Care, Other (non HMO) | Admitting: Occupational Therapy

## 2018-05-12 DIAGNOSIS — M25641 Stiffness of right hand, not elsewhere classified: Secondary | ICD-10-CM

## 2018-05-12 DIAGNOSIS — M6281 Muscle weakness (generalized): Secondary | ICD-10-CM

## 2018-05-12 DIAGNOSIS — R278 Other lack of coordination: Secondary | ICD-10-CM

## 2018-05-12 DIAGNOSIS — L905 Scar conditions and fibrosis of skin: Secondary | ICD-10-CM

## 2018-05-12 NOTE — Therapy (Signed)
[facility] [facility] PHYSICAL AND SPORTS MEDICINE [address] [place], [address] Phone: [phone]   Fax:  [phone]  Occupational Therapy Treatment  Patient Details  Name: Cody Boone MRN: [id number] Date of Birth: 1995/12/28 Referring Provider: Rosita Kea   Encounter Date: 05/12/2018  OT End of Session - 05/12/18 1256    Visit Number  7    Number of Visits  12    Date for OT Re-Evaluation  05/24/18    OT Start Time  1245    OT Stop Time  1319    OT Time Calculation (min)  34 min    Activity Tolerance  Patient tolerated treatment well    Behavior During Therapy  [facility] for tasks assessed/performed       Past Medical History:  Diagnosis Date  . Asthma     Past Surgical History:  Procedure Laterality Date  . OPEN REDUCTION INTERNAL FIXATION (ORIF) PROXIMAL PHALANX Right 03/03/2018   Procedure: OPEN REDUCTION INTERNAL FIXATION (ORIF) PROXIMAL PHALANX-RIGHT RING FINGER;  Surgeon: Kennedy Bucker, MD;  Location: [facility] ORS;  Service: Orthopedics;  Laterality: Right;  . ORBITAL FRACTURE SURGERY Left     There were no vitals filed for this visit.  Subjective Assessment - 05/12/18 1252    Subjective   I can tell the cast got little looser - fell off one night off my finger - but I can tell my  finger is even more straight -     Patient Stated Goals  Pt would like to use his hand more for everyday tasks.    Currently in Pain?  No/denies         [facility] OT Assessment - 05/12/18 0001      Right Hand AROM   R Ring PIP 0-100  --   -10 lateral , dorsal -15              OT Treatments/Exercises (OP) - 05/12/18 0001      RUE Paraffin   Number Minutes Paraffin  8 Minutes    RUE Paraffin Location  Hand    Comments  LMB splint on during paraffin for PIP extention           Scar assess -great improvement in scar adhesionon lateral digit  scar massage and mobs done by OT - using graston tool nr 2 for brushingand vibration  Gentle  mobs and traction to PIP prior to ROM    LMB splint for PIP extention of 4th in  paraffin  PROM for PIP extention - in composite extention and lumbrical fist  AROM blocked PIP extention  Remade new   Quick cast for extention at neutral for PIP extention - to wear most all the time  - other one was loose   5 x off day for flexion  pt ed on use and precautions - pt to do HEP for about 10 days and follow up        OT Education - 05/12/18 1256    Education Details  HEP and splint wearing    Person(s) Educated  Patient    Methods  Explanation;Demonstration    Comprehension  Verbalized understanding;Returned demonstration          OT Long Term Goals - 05/12/18 1355      OT LONG TERM GOAL #1   Title  Patient will be independent with home exercise program for ROM, strength.     Status  Achieved      OT LONG  TERM GOAL #2   Title  Patient will demonstrate understanding and implementation of edema control measures.     Status  Achieved      OT LONG TERM GOAL #3   Title  Patient will demonstrate improved right ring finger extension for functional hand activities.     Status  Achieved      OT LONG TERM GOAL #4   Title  Patient will demonstrate full fisting with fingers to palm and improved grip strength by 15# to pick up and grasp objects    Status  Achieved            Plan - 05/12/18 1258    Clinical Impression Statement  Pt progress very well with use of serial cast for PIP extention to -10 degrees - edema decrease at PIP and maintain function and flexion at PIP - pt still to take cast of 5 x day for AROM     Occupational performance deficits (Please refer to evaluation for details):  ADL's;IADL's;Education;Leisure    Rehab Potential  Excellent    Current Impairments/barriers affecting progress:  lack of extension right ring finger    OT Frequency  1x / week    OT Treatment/Interventions  Self-care/ADL training;Therapeutic exercise;Moist Heat;Paraffin;Neuromuscular  education;Splinting;Patient/family education;Fluidtherapy;Scar mobilization;Therapeutic activities;Ultrasound;Cryotherapy;Contrast Bath;DME and/or AE instruction;Manual Therapy;Passive range of motion    Plan  pt to wear cast for another 10 days - at 0 degrees extention - possible d/c next time     Clinical Decision Making  Limited treatment options, no task modification necessary    OT Home Exercise Plan  see pt instruction     Consulted and Agree with Plan of Care  Patient       Patient will benefit from skilled therapeutic intervention in order to improve the following deficits and impairments:  Increased edema, Impaired flexibility, Decreased coordination, Decreased scar mobility, Decreased range of motion, Decreased strength, Impaired UE functional use  Visit Diagnosis: Stiffness of right hand, not elsewhere classified  Muscle weakness (generalized)  Scar condition and fibrosis of skin  Other lack of coordination    Problem List There are no active problems to display for this patient.   Oletta CohnuPreez, Telisha Zawadzki OTR/L,CLT 05/12/2018, 1:56 PM  [place] [facility] [address] [place], [place], [address] Phone: [phone]   Fax:  [phone]  Name: Cody Boone MRN: [id number] Date of Birth: Jul 21, 1996

## 2018-05-12 NOTE — Patient Instructions (Addendum)
Same HEP and readjust - serial cast refabricated at 0 degrees

## 2018-05-20 ENCOUNTER — Ambulatory Visit: Payer: Managed Care, Other (non HMO) | Admitting: Occupational Therapy

## 2018-05-20 DIAGNOSIS — M25641 Stiffness of right hand, not elsewhere classified: Secondary | ICD-10-CM | POA: Diagnosis not present

## 2018-05-20 DIAGNOSIS — R278 Other lack of coordination: Secondary | ICD-10-CM

## 2018-05-20 DIAGNOSIS — L905 Scar conditions and fibrosis of skin: Secondary | ICD-10-CM

## 2018-05-20 DIAGNOSIS — M6281 Muscle weakness (generalized): Secondary | ICD-10-CM

## 2018-05-20 NOTE — Patient Instructions (Signed)
Same HEP   PROM for PIP extention -  LMB spring splint on as needed  And PIP extention cast on for another 2 wks - and will follow up with me in month

## 2018-05-20 NOTE — Therapy (Signed)
[facility] [address] [place], [place], [address] Phone: [phone]   Fax:  [phone]  Occupational Therapy Treatment  Patient Details  Name: Cody Boone MRN: [id number] Date of Birth: [birth year] Referring Provider: Rosita KeaMenz   Encounter Date: 05/20/2018  OT End of Session - 05/20/18 1317    Visit Number  8    Number of Visits  12    Date for OT Re-Evaluation  05/24/18    OT Start Time  1245    OT Stop Time  1310    OT Time Calculation (min)  25 min    Activity Tolerance  Patient tolerated treatment well    Behavior During Therapy  [facility] for tasks assessed/performed       Past Medical History:  Diagnosis Date  . Asthma     Past Surgical History:  Procedure Laterality Date  . OPEN REDUCTION INTERNAL FIXATION (ORIF) PROXIMAL PHALANX Right 03/03/2018   Procedure: OPEN REDUCTION INTERNAL FIXATION (ORIF) PROXIMAL PHALANX-RIGHT RING FINGER;  Surgeon: Kennedy BuckerMenz, Michael, MD;  Location: [facility] ORS;  Service: Orthopedics;  Laterality: Right;  . ORBITAL FRACTURE SURGERY Left     There were no vitals filed for this visit.  Subjective Assessment - 05/20/18 1314    Subjective   Seen Dr Rosita KeaMenz past Monday - released me - hardware looks good and don't have to come out - doing okay with cast  little loose again - finger getting more straight -     Patient Stated Goals  Pt would like to use his hand more for everyday tasks.         [facility] OT Assessment - 05/20/18 0001      Right Hand AROM   R Ring PIP 0-100  --   -15 lateral , -20 dorsal  PIP ext      AROM assess at PIP for 4th - not as good as last time  And cast from last time loose         OT Treatments/Exercises (OP) - 05/20/18 0001      RUE Paraffin   Number Minutes Paraffin  8 Minutes    RUE Paraffin Location  Hand    Comments  LMB splint on for PIP extention stretch prior to cast  fabiricated         Scar assess -great improvement in scar  adhesionon lateral digit  scar massage and mobs done by OT - using graston tool nr 2 for brushingand vibration  Gentle mobs and traction to PIP prior to ROM    LMB splint for PIP extention of 4th inparaffin  PROM for PIP extention - in composite extention and lumbrical fist  AROM blocked PIP extention  Remade new Quick cast for extention at neutral for PIP extention - to wear most all the time  - other one was loose  5 x off day for flexion  pt ed on use and precautions -follow up in 4 wks         OT Education - 05/20/18 1317    Education Details  HEP and splint use     Person(s) Educated  Patient    Methods  Demonstration;Explanation    Comprehension  Returned demonstration;Verbalized understanding          OT Long Term Goals - 05/12/18 1355      OT LONG TERM GOAL #1   Title  Patient will be independent with home exercise program for ROM, strength.  Status  Achieved      OT LONG TERM GOAL #2   Title  Patient will demonstrate understanding and implementation of edema control measures.     Status  Achieved      OT LONG TERM GOAL #3   Title  Patient will demonstrate improved right ring finger extension for functional hand activities.     Status  Achieved      OT LONG TERM GOAL #4   Title  Patient will demonstrate full fisting with fingers to palm and improved grip strength by 15# to pick up and grasp objects    Status  Achieved            Plan - 05/20/18 1317    Clinical Impression Statement  Pt was progressing weekly - this date was not as good as last week - new PIP extention cast fabricated and pt to wear for another 2-4 wks and follow up for possible discharge - to cont with PROM end range PIP extention too     Occupational performance deficits (Please refer to evaluation for details):  ADL's;IADL's;Education;Leisure    Rehab Potential  Excellent    Current Impairments/barriers affecting progress:  lack of extension right ring finger    OT  Frequency  Monthly    OT Duration  4 weeks    OT Treatment/Interventions  Self-care/ADL training;Therapeutic exercise;Moist Heat;Paraffin;Neuromuscular education;Splinting;Patient/family education;Fluidtherapy;Scar mobilization;Therapeutic activities;Ultrasound;Cryotherapy;Contrast Bath;DME and/or AE instruction;Manual Therapy;Passive range of motion    Plan  pt to wear cast for another 2-4 wks  - at 0 degrees extention made-follow up in month     Clinical Decision Making  Limited treatment options, no task modification necessary    OT Home Exercise Plan  see pt instruction        Patient will benefit from skilled therapeutic intervention in order to improve the following deficits and impairments:  Increased edema, Impaired flexibility, Decreased coordination, Decreased scar mobility, Decreased range of motion, Decreased strength, Impaired UE functional use  Visit Diagnosis: Stiffness of right hand, not elsewhere classified  Muscle weakness (generalized)  Scar condition and fibrosis of skin  Other lack of coordination    Problem List There are no active problems to display for this patient.   Oletta CohnuPreez, Mistee Soliman OTR/L,CLT 05/20/2018, 2:10 PM  [place] [facility] [address] [place], [place], [address] Phone: [phone]   Fax:  [phone]  Name: Cody Knifeyson K Wool MRN: [id number] Date of Birth: 08/26/1996

## 2019-10-10 ENCOUNTER — Other Ambulatory Visit: Payer: Self-pay | Admitting: Sports Medicine

## 2019-10-10 DIAGNOSIS — M25461 Effusion, right knee: Secondary | ICD-10-CM

## 2019-10-10 DIAGNOSIS — S8991XA Unspecified injury of right lower leg, initial encounter: Secondary | ICD-10-CM

## 2019-10-17 ENCOUNTER — Ambulatory Visit
Admission: RE | Admit: 2019-10-17 | Discharge: 2019-10-17 | Disposition: A | Discharge status: Home / Self Care | Payer: Managed Care, Other (non HMO) | Source: Ambulatory Visit | Attending: Sports Medicine | Admitting: Sports Medicine

## 2019-10-17 ENCOUNTER — Other Ambulatory Visit: Payer: Self-pay

## 2019-10-17 DIAGNOSIS — M25461 Effusion, right knee: Secondary | ICD-10-CM | POA: Diagnosis present

## 2019-10-17 DIAGNOSIS — S8991XA Unspecified injury of right lower leg, initial encounter: Secondary | ICD-10-CM | POA: Diagnosis present

## 2019-10-20 ENCOUNTER — Other Ambulatory Visit: Payer: Self-pay | Admitting: Orthopedic Surgery

## 2019-10-20 DIAGNOSIS — U071 COVID-19: Secondary | ICD-10-CM

## 2019-10-20 HISTORY — DX: COVID-19: U07.1

## 2019-10-24 ENCOUNTER — Encounter: Admission: RE | Admit: 2019-10-24 | Payer: Managed Care, Other (non HMO) | Source: Ambulatory Visit

## 2019-10-25 ENCOUNTER — Other Ambulatory Visit: Payer: Managed Care, Other (non HMO)

## 2019-10-30 ENCOUNTER — Other Ambulatory Visit: Payer: Self-pay | Admitting: Orthopedic Surgery

## 2019-11-02 ENCOUNTER — Other Ambulatory Visit: Payer: Self-pay

## 2019-11-02 ENCOUNTER — Encounter: Payer: Self-pay | Admitting: Orthopedic Surgery

## 2019-11-03 ENCOUNTER — Other Ambulatory Visit: Admission: RE | Admit: 2019-11-03 | Payer: Managed Care, Other (non HMO) | Source: Ambulatory Visit

## 2019-11-08 ENCOUNTER — Other Ambulatory Visit: Admission: RE | Admit: 2019-11-08 | Payer: Managed Care, Other (non HMO) | Source: Ambulatory Visit

## 2019-11-08 NOTE — Anesthesia Preprocedure Evaluation (Addendum)
Anesthesia Evaluation  Patient identified by MRN, date of birth, ID band Patient awake    Reviewed: Allergy & Precautions, NPO status , Patient's Chart, lab work & pertinent test results  History of Anesthesia Complications Negative for: history of anesthetic complications  Airway Mallampati: II  TM Distance: >3 FB Neck ROM: Full    Dental   Pulmonary asthma , former smoker,   COVID+ 10/25/19   breath sounds clear to auscultation       Cardiovascular (-) angina(-) DOE  Rhythm:Regular Rate:Normal     Neuro/Psych    GI/Hepatic neg GERD  ,  Endo/Other    Renal/GU      Musculoskeletal   Abdominal   Peds  Hematology   Anesthesia Other Findings   Reproductive/Obstetrics                            Anesthesia Physical Anesthesia Plan  ASA: I  Anesthesia Plan: General   Post-op Pain Management:  Regional for Post-op pain   Induction: Intravenous  PONV Risk Score and Plan: 2 and Ondansetron, Treatment may vary due to age or medical condition and Midazolam  Airway Management Planned: LMA  Additional Equipment:   Intra-op Plan:   Post-operative Plan: Extubation in OR  Informed Consent: I have reviewed the patients History and Physical, chart, labs and discussed the procedure including the risks, benefits and alternatives for the proposed anesthesia with the patient or authorized representative who has indicated his/her understanding and acceptance.       Plan Discussed with: CRNA and Anesthesiologist  Anesthesia Plan Comments:        Anesthesia Quick Evaluation

## 2019-11-10 ENCOUNTER — Encounter
Admission: RE | Disposition: A | Discharge status: Home / Self Care | Payer: Self-pay | Source: Home / Self Care | Attending: Orthopedic Surgery

## 2019-11-10 ENCOUNTER — Encounter: Payer: Self-pay | Admitting: Orthopedic Surgery

## 2019-11-10 ENCOUNTER — Ambulatory Visit: Payer: Managed Care, Other (non HMO) | Admitting: Anesthesiology

## 2019-11-10 ENCOUNTER — Other Ambulatory Visit: Payer: Self-pay

## 2019-11-10 ENCOUNTER — Ambulatory Visit
Admission: RE | Admit: 2019-11-10 | Discharge: 2019-11-10 | Disposition: A | Discharge status: Home / Self Care | Payer: Managed Care, Other (non HMO) | Attending: Orthopedic Surgery | Admitting: Orthopedic Surgery

## 2019-11-10 DIAGNOSIS — Z79899 Other long term (current) drug therapy: Secondary | ICD-10-CM | POA: Diagnosis not present

## 2019-11-10 DIAGNOSIS — X509XXA Other and unspecified overexertion or strenuous movements or postures, initial encounter: Secondary | ICD-10-CM | POA: Insufficient documentation

## 2019-11-10 DIAGNOSIS — Z91010 Allergy to peanuts: Secondary | ICD-10-CM | POA: Insufficient documentation

## 2019-11-10 DIAGNOSIS — Z87891 Personal history of nicotine dependence: Secondary | ICD-10-CM | POA: Insufficient documentation

## 2019-11-10 DIAGNOSIS — Z880 Allergy status to penicillin: Secondary | ICD-10-CM | POA: Diagnosis not present

## 2019-11-10 DIAGNOSIS — Z8489 Family history of other specified conditions: Secondary | ICD-10-CM | POA: Diagnosis not present

## 2019-11-10 DIAGNOSIS — J45909 Unspecified asthma, uncomplicated: Secondary | ICD-10-CM | POA: Diagnosis not present

## 2019-11-10 DIAGNOSIS — S83511A Sprain of anterior cruciate ligament of right knee, initial encounter: Secondary | ICD-10-CM | POA: Diagnosis present

## 2019-11-10 DIAGNOSIS — Z833 Family history of diabetes mellitus: Secondary | ICD-10-CM | POA: Insufficient documentation

## 2019-11-10 DIAGNOSIS — X500XXA Overexertion from strenuous movement or load, initial encounter: Secondary | ICD-10-CM | POA: Insufficient documentation

## 2019-11-10 DIAGNOSIS — S83281A Other tear of lateral meniscus, current injury, right knee, initial encounter: Secondary | ICD-10-CM | POA: Insufficient documentation

## 2019-11-10 DIAGNOSIS — Z91013 Allergy to seafood: Secondary | ICD-10-CM | POA: Insufficient documentation

## 2019-11-10 DIAGNOSIS — Y9389 Activity, other specified: Secondary | ICD-10-CM | POA: Diagnosis not present

## 2019-11-10 HISTORY — PX: KNEE ARTHROSCOPY WITH ANTERIOR CRUCIATE LIGAMENT (ACL) REPAIR: SHX5644

## 2019-11-10 SURGERY — KNEE ARTHROSCOPY WITH ANTERIOR CRUCIATE LIGAMENT (ACL) REPAIR
Anesthesia: General | Site: Knee | Laterality: Right

## 2019-11-10 MED ORDER — CHLORHEXIDINE GLUCONATE 4 % EX LIQD: 60.0000 mL | Freq: Once | CUTANEOUS | Status: DC

## 2019-11-10 MED ORDER — ONDANSETRON 4 MG PO TBDP: 4.0000 mg | ORAL_TABLET | Freq: Three times a day (TID) | ORAL | 0 refills | Status: AC | PRN

## 2019-11-10 MED ORDER — MEPERIDINE HCL 25 MG/ML IJ SOLN: 6.2500 mg | INTRAMUSCULAR | Status: DC | PRN

## 2019-11-10 MED ORDER — PROMETHAZINE HCL 25 MG/ML IJ SOLN: 6.2500 mg | INTRAMUSCULAR | Status: DC | PRN

## 2019-11-10 MED ORDER — GLYCOPYRROLATE 0.2 MG/ML IJ SOLN
INTRAMUSCULAR | Status: DC | PRN
  Administered 2019-11-10: .2 mg via INTRAVENOUS

## 2019-11-10 MED ORDER — LIDOCAINE HCL (CARDIAC) PF 100 MG/5ML IV SOSY
PREFILLED_SYRINGE | INTRAVENOUS | Status: DC | PRN
  Administered 2019-11-10: 50 mg via INTRATRACHEAL

## 2019-11-10 MED ORDER — OXYCODONE HCL 5 MG/5ML PO SOLN: 5.0000 mg | Freq: Once | ORAL | Status: AC | PRN

## 2019-11-10 MED ORDER — ACETAMINOPHEN 500 MG PO TABS: 1000.0000 mg | ORAL_TABLET | Freq: Three times a day (TID) | ORAL | 2 refills | Status: AC

## 2019-11-10 MED ORDER — PROPOFOL 10 MG/ML IV BOLUS
INTRAVENOUS | Status: DC | PRN
  Administered 2019-11-10: 200 mg via INTRAVENOUS

## 2019-11-10 MED ORDER — FENTANYL CITRATE (PF) 100 MCG/2ML IJ SOLN
INTRAMUSCULAR | Status: DC | PRN
  Administered 2019-11-10 (×3): 100 ug via INTRAVENOUS

## 2019-11-10 MED ORDER — KETAMINE HCL 50 MG/ML IJ SOLN
INTRAMUSCULAR | Status: DC | PRN
  Administered 2019-11-10: 50 mg via INTRAMUSCULAR

## 2019-11-10 MED ORDER — ROPIVACAINE HCL 5 MG/ML IJ SOLN
INTRAMUSCULAR | Status: DC | PRN
  Administered 2019-11-10: 20 mL via EPIDURAL

## 2019-11-10 MED ORDER — CLINDAMYCIN PHOSPHATE 900 MG/50ML IV SOLN
900.0000 mg | INTRAVENOUS | Status: AC
  Administered 2019-11-10: 900 mg via INTRAVENOUS

## 2019-11-10 MED ORDER — OXYCODONE HCL 5 MG PO TABS
5.0000 mg | ORAL_TABLET | Freq: Once | ORAL | Status: AC | PRN
  Administered 2019-11-10: 11:00:00 5 mg via ORAL

## 2019-11-10 MED ORDER — OXYCODONE HCL 5 MG PO TABS: 5.0000 mg | ORAL_TABLET | ORAL | 0 refills | Status: AC | PRN

## 2019-11-10 MED ORDER — HYDROMORPHONE HCL 1 MG/ML IJ SOLN: 0.2500 mg | INTRAMUSCULAR | Status: DC | PRN

## 2019-11-10 MED ORDER — LACTATED RINGERS IV SOLN
100.0000 mL/h | INTRAVENOUS | Status: DC
  Administered 2019-11-10: 100 mL/h via INTRAVENOUS

## 2019-11-10 MED ORDER — DEXAMETHASONE SODIUM PHOSPHATE 4 MG/ML IJ SOLN
INTRAMUSCULAR | Status: DC | PRN
  Administered 2019-11-10: 8 mg via INTRAVENOUS

## 2019-11-10 MED ORDER — ONDANSETRON HCL 4 MG/2ML IJ SOLN
INTRAMUSCULAR | Status: DC | PRN
  Administered 2019-11-10: 4 mg via INTRAVENOUS

## 2019-11-10 MED ORDER — MIDAZOLAM HCL 2 MG/2ML IJ SOLN
INTRAMUSCULAR | Status: DC | PRN
  Administered 2019-11-10 (×2): 2 mg via INTRAVENOUS

## 2019-11-10 MED ORDER — LACTATED RINGERS IV SOLN: INTRAVENOUS | Status: DC | PRN

## 2019-11-10 MED ORDER — ASPIRIN EC 325 MG PO TBEC: 325.0000 mg | DELAYED_RELEASE_TABLET | Freq: Every day | ORAL | 0 refills | Status: AC

## 2019-11-10 MED ORDER — IBUPROFEN 800 MG PO TABS: 800.0000 mg | ORAL_TABLET | Freq: Three times a day (TID) | ORAL | 1 refills | Status: AC

## 2019-11-10 SURGICAL SUPPLY — 95 items
ADAPTER IRRIG TUBE 2 SPIKE SOL (ADAPTER) ×6 IMPLANT
ANCHOR BUTTON TIGHTROPE 14 (Button) ×3 IMPLANT
BASIN GRAD PLASTIC 32OZ STRL (MISCELLANEOUS) ×3 IMPLANT
BLADE SURG 15 STRL LF DISP TIS (BLADE) ×3 IMPLANT
BLADE SURG 15 STRL SS (BLADE) ×6
BLADE SURG SZ10 CARB STEEL (BLADE) ×3 IMPLANT
BLADE SURG SZ11 CARB STEEL (BLADE) ×3 IMPLANT
BNDG COHESIVE 4X5 TAN STRL (GAUZE/BANDAGES/DRESSINGS) ×3 IMPLANT
BNDG COHESIVE 6X5 TAN STRL LF (GAUZE/BANDAGES/DRESSINGS) ×3 IMPLANT
BNDG ELASTIC 6X5.8 VLCR STR LF (GAUZE/BANDAGES/DRESSINGS) ×3 IMPLANT
BNDG ESMARK 6X12 TAN STRL LF (GAUZE/BANDAGES/DRESSINGS) ×3 IMPLANT
BUR RADIUS 3.5 (BURR) ×3 IMPLANT
BUR RADIUS 4.0X18.5 (BURR) ×3 IMPLANT
CARTRIDGE SUT 2-0 NONSTITCH (Anchor) ×12 IMPLANT
CHLORAPREP W/TINT 26ML (MISCELLANEOUS) ×3 IMPLANT
CLEANER CAUTERY TIP 5X5 PAD (MISCELLANEOUS) ×1 IMPLANT
CLOSURE WOUND 1/2 X4 (GAUZE/BANDAGES/DRESSINGS) ×1
COOLER POLAR GLACIER W/PUMP (MISCELLANEOUS) ×3 IMPLANT
COVER LIGHT HANDLE UNIVERSAL (MISCELLANEOUS) ×6 IMPLANT
COVER MAYO STAND STRL (DRAPES) ×3 IMPLANT
CUFF TOURN SGL QUICK 30 (TOURNIQUET CUFF) ×2
CUFF TOURN SGL QUICK 34 (TOURNIQUET CUFF) ×2
CUFF TRNQT CYL 30X4X21-28X (TOURNIQUET CUFF) ×1 IMPLANT
CUFF TRNQT CYL 34X4X40X1 (TOURNIQUET CUFF) ×1 IMPLANT
CUTTER SUT KNOT PUSHER AIR (CUTTER) ×3 IMPLANT
DERMABOND ADVANCED (GAUZE/BANDAGES/DRESSINGS) ×2
DERMABOND ADVANCED .7 DNX12 (GAUZE/BANDAGES/DRESSINGS) ×1 IMPLANT
DEVICE MENISCAL CVD UP (Anchor) ×3 IMPLANT
DRAPE C-ARM XRAY 36X54 (DRAPES) ×3 IMPLANT
DRAPE FLUOR MINI C-ARM 54X84 (DRAPES) ×3 IMPLANT
DRAPE IMP U-DRAPE 54X76 (DRAPES) ×3 IMPLANT
DRAPE INCISE IOBAN 66X45 STRL (DRAPES) ×3 IMPLANT
DRAPE LEGGINS SURG 28X43 STRL (DRAPES) ×3 IMPLANT
DRAPE SHEET LG 3/4 BI-LAMINATE (DRAPES) ×3 IMPLANT
DRAPE STERI 35X30 U-POUCH (DRAPES) ×3 IMPLANT
DRAPE TABLE BACK 80X90 (DRAPES) ×3 IMPLANT
DRAPE U-SHAPE 48X52 POLY STRL (PACKS) ×3 IMPLANT
DRILL FLIPCUTTER II 8.5MM (INSTRUMENTS) ×1 IMPLANT
ELECT REM PT RETURN 9FT ADLT (ELECTROSURGICAL) ×3
ELECTRODE REM PT RTRN 9FT ADLT (ELECTROSURGICAL) ×1 IMPLANT
FLIPCUTTER II 8.5MM (INSTRUMENTS) ×3
GAUZE SPONGE 4X4 12PLY STRL (GAUZE/BANDAGES/DRESSINGS) ×3 IMPLANT
GAUZE XEROFORM 1X8 LF (GAUZE/BANDAGES/DRESSINGS) ×3 IMPLANT
GLOVE BIO SURGEON STRL SZ7.5 (GLOVE) ×6 IMPLANT
GLOVE BIOGEL PI IND STRL 8 (GLOVE) ×2 IMPLANT
GLOVE BIOGEL PI INDICATOR 8 (GLOVE) ×4
GOWN STRL REUS W/ TWL LRG LVL3 (GOWN DISPOSABLE) ×1 IMPLANT
GOWN STRL REUS W/ TWL XL LVL3 (GOWN DISPOSABLE) ×1 IMPLANT
GOWN STRL REUS W/TWL LRG LVL3 (GOWN DISPOSABLE) ×2
GOWN STRL REUS W/TWL XL LVL3 (GOWN DISPOSABLE) ×2
HANDLE YANKAUER SUCT BULB TIP (MISCELLANEOUS) ×3 IMPLANT
IMPL TIGHTROP ABS ACL FIBERTG (Orthopedic Implant) ×1 IMPLANT
IMPL TIGHTROP FIBERTAG ACL (Orthopedic Implant) ×1 IMPLANT
IMPL TIGHTROPE ABS ACL FIBERTG (Orthopedic Implant) ×3 IMPLANT
IMPLANT TIGHTROPE FIBERTAG ACL (Orthopedic Implant) ×3 IMPLANT
IV LACTATED RINGER IRRG 3000ML (IV SOLUTION) ×16
IV LR IRRIG 3000ML ARTHROMATIC (IV SOLUTION) ×8 IMPLANT
KIT TRANSTIBIAL (DISPOSABLE) ×3 IMPLANT
KIT TURNOVER KIT A (KITS) ×3 IMPLANT
KNIFE BLADE PARALLEL SZ10 (BLADE) ×3 IMPLANT
MANAGER SUT NOVOCUT (CUTTER) ×3 IMPLANT
MAT ABSORB  FLUID 56X50 GRAY (MISCELLANEOUS) ×6
MAT ABSORB FLUID 56X50 GRAY (MISCELLANEOUS) ×3 IMPLANT
NEEDLE HYPO 22GX1.5 SAFETY (NEEDLE) ×3 IMPLANT
NEEDLE SPNL 18GX3.5 QUINCKE PK (NEEDLE) ×3 IMPLANT
NEPTUNE MANIFOLD (MISCELLANEOUS) ×6 IMPLANT
NOVOSTICH PRO MENISCAL 2-0 (Miscellaneous) ×3 IMPLANT
PACK ARTHROSCOPY KNEE (MISCELLANEOUS) ×3 IMPLANT
PAD ABD DERMACEA PRESS 5X9 (GAUZE/BANDAGES/DRESSINGS) ×3 IMPLANT
PAD CLEANER CAUTERY TIP 5X5 (MISCELLANEOUS) ×2
PAD WRAPON POLAR KNEE (MISCELLANEOUS) ×1 IMPLANT
PENCIL SMOKE EVACUATOR (MISCELLANEOUS) ×6 IMPLANT
REAMER ARTHRO 9.5 SS (MISCELLANEOUS) ×3 IMPLANT
SET TUBE SUCT SHAVER OUTFL 24K (TUBING) ×3 IMPLANT
SET TUBE TIP INTRA-ARTICULAR (MISCELLANEOUS) ×3 IMPLANT
SPONGE LAP 18X18 RF (DISPOSABLE) ×3 IMPLANT
STRIP CLOSURE SKIN 1/2X4 (GAUZE/BANDAGES/DRESSINGS) ×2 IMPLANT
SUCTION FRAZIER HANDLE 10FR (MISCELLANEOUS) ×2
SUCTION TUBE FRAZIER 10FR DISP (MISCELLANEOUS) ×1 IMPLANT
SUT ETHILON 3-0 FS-10 30 BLK (SUTURE) ×3
SUT FIBERWIRE #2 38 T-5 BLUE (SUTURE) ×3
SUT MNCRL 4-0 (SUTURE) ×2
SUT MNCRL 4-0 27XMFL (SUTURE) ×1
SUT VIC AB 0 CT1 36 (SUTURE) ×3 IMPLANT
SUT VIC AB 2-0 CT2 27 (SUTURE) ×6 IMPLANT
SUTURE EHLN 3-0 FS-10 30 BLK (SUTURE) ×1 IMPLANT
SUTURE FIBERWR #2 38 T-5 BLUE (SUTURE) ×1 IMPLANT
SUTURE MNCRL 4-0 27XMF (SUTURE) ×1 IMPLANT
SYR BULB IRRIG 60ML STRL (SYRINGE) ×3 IMPLANT
SYSTEM IMPL ACL/PCL SWIVILLOCK (Anchor) ×3 IMPLANT
SYSTEM NVSTCH PRO MENISCAL 2-0 (Miscellaneous) ×1 IMPLANT
TRAY FOLEY SLVR 16FR LF STAT (SET/KITS/TRAYS/PACK) ×3 IMPLANT
TUBING ARTHRO INFLOW-ONLY STRL (TUBING) ×3 IMPLANT
WAND WEREWOLF FLOW 90D (MISCELLANEOUS) ×3 IMPLANT
WRAPON POLAR PAD KNEE (MISCELLANEOUS) ×3

## 2019-11-10 NOTE — Op Note (Signed)
Operative Note    SURGERY DATE: 11/10/2019   PRE-OP DIAGNOSIS:  1. Right knee anterior cruciate ligament tear 2. Right lateral meniscus tear 3. Right medial meniscus tear (RAMP lesion)   POST-OP DIAGNOSIS:  1. Right knee anterior cruciate ligament tear 2. Right lateral meniscus tear  PROCEDURES:  1. Right knee anterior cruciate ligament reconstruction with quadriceps tendon autograft 2. Right lateral meniscus repair   SURGEON: Rosealee Albee, MD  ASSISTANT: Dedra Skeens, PA    ANESTHESIA: adductor canal nerve block   ESTIMATED BLOOD LOSS: 10cc   TOTAL IV FLUIDS: per anesthesia  INDICATION(S):  Cody Boone is a 24 y.o. male who initially had a twisting injury to his right knee injury that occurred while hunting.  MRI showed an ACL tear and lateral meniscus tear with suggestion of medial meniscus RAMP lesion, which was consistent with the clinical exam. We discussed risks of surgery including but not limited to possible ACL and/or meniscus re-tear, infection, bleeding, muscle/nerve damage, DVT, complications of anesthesia, and postoperative knee pain and arthrofibrosis. After discussion of risks, benefits, and alternatives to surgery, the patient and family elected to proceed.  After discussion of risks, benefits, and alternatives to surgery, the patient elected to proceed.     OPERATIVE FINDINGS:    Examination under anesthesia: A careful examination under anesthesia was performed.  Passive range of motion was: Hyperextension: 7.  Extension: 0.  Flexion: 140.  Lachman: 2B. Pivot Shift: grade 2.  Posterior drawer: normal.  Varus stability in full extension: normal.  Varus stability in 30 degrees of flexion: normal.  Valgus stability in full extension: normal.  Valgus stability in 30 degrees of flexion: normal.   Intra-operative findings: A thorough arthroscopic examination of the knee was performed.  The findings are: 1. Suprapatellar pouch: Normal 2. Undersurface of median  ridge: Normal 3. Medial patellar facet: Normal 4. Lateral patellar facet: Normal 5. Trochlea: Normal 6. Lateral gutter/popliteus tendon: Normal 7. Hoffa's fat pad: Normal 8. Medial gutter/plica: Normal 9. ACL: Abnormal: complete femoral avulsion 10. PCL: Normal 11. Medial meniscus: normal 12. Medial compartment cartilage: Normal 13. Lateral meniscus: complete radial tear of the posterior horn ~65mm lateral to the posterior horn meniscus root  14. Lateral compartment cartilage: Normal   OPERATIVE REPORT:     I identified Cody Boone in the pre-operative holding area.  I marked the operative knee with my initials. I reviewed the risks and benefits of the proposed surgical intervention and the patient (and/or patient's guardian) wished to proceed.  Anesthesia was then performed with an adductor canal nerve block.  The patient was transferred to the operative suite and placed in the supine position with all bony prominences padded.  Care was taken to ensure that the contralateral leg was placed in neutral position and that the operative leg was well-padded in the leg holder.     Appropriate IV antibiotics were administered within 30 minutes of incision. The extremity was then prepped and draped in standard fashion. A time out was performed confirming the correct extremity, correct patient and correct procedure.   Given the clear presence of a pivot shift on examination under anesthesia, I first directed my attention to the harvest of a quadriceps autograft.  The right lower extremity was exsanguinated with an Esmarch, and a thigh tourniquet was elevated to 250 mmHg.  The total tourniquet time for this case was 125 minutes.     A 3cm incision was planned just proximal to the proximal pole of the patella.  The incision was made with a 15 blade, and subcutaneous fat was sharply excised to expose the quadriceps tendon.  The paratenon was sharply incised, and the space in between the paratenon and  the quadriceps tendon was bluntly developed with a sponge and a key elevator.  A speculum retractor was placed anteriorly, and the quadriceps was easily visualized with the arthroscope.  The vastus lateralis and VMO were clearly identified, as was the junction of the rectus femoris muscle with the proximal aspect of the quadriceps tendon.  Under direct visualization with the arthroscope, an Arthrex 10 mm parallel blade was used to incise the quadriceps tendon from its most proximal extent, to the junction with the patella.  Care was taken not to violate the rectus femoris muscle.  Then, using a 15 blade, the graft was transected and elevated distally off the patella, creating a 7 mm thick partial thickness graft.  Dissection was carried proximally to create a uniformly thick graft, 70 mm in length.  The distal end of the graft was controlled with a #2 Fiberwire stitch, and the Arthrex quadriceps harvester/cutter was loaded over the graft.  At a length of 70 mm, the harvest/cutter was used to transect the graft proximally, and the graft was removed from the wound.  The arthroscope was used to confirm a partial thickness harvest with no violation of the anterior knee capsule.     On the back table, the graft was prepared in standard fashion.  The length of the graft was 70 mm.  Each end was prepared using an Arboriculturist.  The femoral end was secured around a TightRope RT, and the tibial end was secured around an ABS loop.  The femoral end of the graft was 8.5 mm in diameter, the tibial end was 9.30mm in diameter.  The graft was tensioned to 20 lbs and reserved for later use.     Standard anterolateral portals was created with an 11-blade.  The arthroscope was introduced through the anterolateral portal, and a full diagnostic arthroscopy was performed as described above.   An anteromedial portal was made under needle localization. A shaver was introduced through the anteromedial portal and used to  gently debride the fat pad to improve visualization. Then the ACL remnant was debrided using the shaver, leaving 1-2 mm stumps on the tibia for anatomic referencing.  The lateral meniscus was then addressed. A rasp was used to roughen the capsular tissue about the meniscus. A shaver was used to debride the region of the tear so that there were freshened meniscus edges. A Ceterix Novostitch device was used to place 3 side-to-side stitches with the knots tied arthroscopically from the anterolateral portal.  This appropriately reduced the meniscus tear.  An additional IVY Air all inside device was used to augment the fixation by placing a vertical mattress stitch in the lateral aspect of the posterior horn of the meniscus from the anteromedial portal. The tear was probed afterwards and found to be stable and appropriately reduced.   Next, I created the femoral socket. This was performed with an outside-in technique using an Forensic psychologist. The femoral guide was hooked around the back wall of the femur and the guide indicated where the lateral femoral incision should be. We made this incision with a 15 blade and followed the angle of the drill sleeve to make the incision through the IT band down to bone. The drill sleeve was pushed down to bone on the lateral femoral condyle and the guide  was placed on the anatomic footprint of the ACL. We drilled an 8.61mm tunnel that was 19mm in length. We then used a FiberStick to pass a suture through the femoral tunnel and out of the anteromedial portal.    I then directed my attention to preparation of the tibial tunnel. A tibial guide set at 65 degrees was inserted through the anteromedial portal and centered over the tibial footprint.  The drill sleeve was then advanced to the proximal medial tibia just at the junction of the tibial tubercle and the pes tendon, through a ~3cm incision.  The anticipated tunnel length was 23mm.  A guide pin was then drilled through the  proximal tibia under direct arthroscopic visualization into the center of the ACL footprint.  This was then over-reamed with an Arthrex 9.35mm reamer. Dilators were used to dilate the tibial tunnel.  Bony debris and soft tissue was cleared from the metaphyseal opening with electrocautery and from the intra-articular aperture with a shaver.   The passing suture was then brought out of the tibial tunnel.  The graft was then advanced into place in standard fashion.  The femoral Tight Rope was deployed on the lateral cortex under direct arthroscopic visualization from the anteromedial portal.  Correct position on the lateral cortex was confirmed fluoroscopically.  The TightRope was then shortened until at least 20 mm of graft was in the femoral tunnel.     I then directed my attention to tibial fixation.  This was performed with the knee in full extension with an axial and posterior drawer load applied to the tibia.  A 64mm ABS concave button was loaded over the ABS loop, and the loop was shortened until the button was flush with the anteromedial tibial cortex. The knee was then cycled 20 times, and both the femoral and tibial button were tightened as much as possible with the knee in full extension. The tibial sutures were tightened, and a knot was placed over the button.  A hole for a 4.75 mm SwiveLock was drilled approximately 2 cm distal to the tibial tunnel.  The ends of the tibial sutures were placed under tension and the anchor was advanced.  This served as a back-up tibial fixation.   A repeat examination under anesthesia was performed.  The patient retained full hyperextension and flexion.  The Lachman's was normalized.  The arthroscope was re-introduced into the knee joint, confirming excellent position and tension of the quadriceps autograft.  There was no lateral wall or roof impingement.  Tibial and femoral sutures were cut.   The wounds were irrigated. 2-0 Vicryl was used to close the quadriceps  paratenon.  0 Vicryl was used to close the sartorius fascia.  2-0 Vicryl was used to close the subdermal layers of both the quadriceps tendon harvest and the tibial tunnel incisions.  The harvest incision and the proximal medial tibial incisions were then closed with 4-0 Monocryl and Dermabond.  The arthroscopy portals and lateral femoral incision were closed with 3-0 Nylon.  A sterile dressing was applied, followed by a Polar Care device and a hinged knee brace locked in full extension.   The patient was awakened from anesthesia without difficulty and was transferred to the PACU in stable condition.   Of note, services of a PA were essential to performing the surgery. PA was able to assist in patient positioning, exposure, retraction, drilling, and suturing the wound.   POSTOPERATIVE PLAN: The patient will be discharged home today once they meet PACU criteria.  FFWB x 4 weeks. WBAT with crutches from weeks 4-6. Aspirin 325 mg daily for 2 weeks for DVT prophylaxis. Start physical therapy on POD#3-4. Follow up in 2 weeks per protocol.

## 2019-11-10 NOTE — Progress Notes (Signed)
Assisted Erica Hensier ANMD with right, ultrasound guided, adductor canal block. Side rails up, monitors on throughout procedure. See vital signs in flow sheet. Tolerated Procedure well.  

## 2019-11-10 NOTE — Transfer of Care (Signed)
Immediate Anesthesia Transfer of Care Note  Patient: Cody Boone  Procedure(s) Performed: KNEE ARTHROSCOPY WITH ANTERIOR CRUCIATE LIGAMENT (ACL) REPAIR USING QUADRICEPS TENDON AUTOGRAFT, LATERAL MENISCUS REPAIR (Right Knee)  Patient Location: PACU  Anesthesia Type: General  Level of Consciousness: awake, alert  and patient cooperative  Airway and Oxygen Therapy: Patient Spontanous Breathing and Patient connected to supplemental oxygen  Post-op Assessment: Post-op Vital signs reviewed, Patient's Cardiovascular Status Stable, Respiratory Function Stable, Patent Airway and No signs of Nausea or vomiting  Post-op Vital Signs: Reviewed and stable  Complications: No apparent anesthesia complications

## 2019-11-10 NOTE — Anesthesia Postprocedure Evaluation (Signed)
Anesthesia Post Note  Patient: Cody Boone  Procedure(s) Performed: KNEE ARTHROSCOPY WITH ANTERIOR CRUCIATE LIGAMENT (ACL) REPAIR USING QUADRICEPS TENDON AUTOGRAFT, LATERAL MENISCUS REPAIR (Right Knee)     Patient location during evaluation: PACU Anesthesia Type: General Level of consciousness: awake and alert Pain management: pain level controlled Vital Signs Assessment: post-procedure vital signs reviewed and stable Respiratory status: spontaneous breathing, nonlabored ventilation, respiratory function stable and patient connected to nasal cannula oxygen Cardiovascular status: blood pressure returned to baseline and stable Postop Assessment: no apparent nausea or vomiting Anesthetic complications: no    Starlyn Droge A  Elfrieda Espino

## 2019-11-10 NOTE — Discharge Instructions (Signed)
Arthroscopic ACL Surgery with Meniscus Repair   Post-Op Instructions   1. Bracing or crutches: Crutches will be provided at the time of discharge from the surgery center.    2. Ice: You may be provided with a device [facility]) that allows you to ice the affected area effectively. Otherwise you can ice manually.   3. Driving:  Driving: Off all narcotic pain meds when operating vehicle   1 week for automatic cars, left leg surgery  4 weeks for standard/manual cars or right leg surgery   4. Activity: Ankle pumps several times an hour while awake to prevent blood clots. Weight bearing: foot-flat weight bearing is permitted with brace locked in extension. The brace should not be unlocked in order to protect the meniscus repair. Unlock only for hygiene and for exercises as directed by physical therapist. Elevate knee above heart level as much as possible for one week. Avoid standing more than 5 minutes (consecutively) for the first week. No exercise involving the knee until cleared by the surgeon or physical therapist. Ideally, you should avoid long distance travel for 4 weeks.   5. Medications:  - You have been provided a prescription for narcotic pain medicine. After surgery, take 1-2 narcotic tablets every 4 hours if needed for severe pain.  - A prescription for anti-nausea medication will be provided in case the narcotic medicine causes nausea - take 1 tablet every 6 hours only if nauseated.  - Take ibuprofen 800 mg every 8 hours with food to reduce post-operative knee swelling. DO NOT STOP IBUPROFEN POST-OP UNTIL INSTRUCTED TO DO SO at first post-op office visit (10-14 days after surgery).  - Take enteric coated aspirin 325 mg once daily for 2 weeks to prevent blood clots.  - Take tylenol 1000mg  (two extra strength tablets) every 8 hours for pain.  May stop tylenol 5 days after surgery if you are having minimal pain. - You can take an over the counter stool softener to help with narcotic related  constipation (Colace, Senna, Miralax, etc.)   If you are taking prescription medication for anxiety, depression, insomnia, muscle spasm, chronic pain, or for attention deficit disorder you are advised that you are at a higher risk of adverse effects with use of narcotics post-op, including narcotic addiction/dependence, depressed breathing, death. If you use non-prescribed substances: alcohol, marijuana, cocaine, heroin, methamphetamines, etc., you are at a higher risk of adverse effects with use of narcotics post-op, including narcotic addiction/dependence, depressed breathing, death. You are advised that taking > 50 morphine milligram equivalents (MME) of narcotic pain medication per day results in twice the risk of overdose or death. For your prescription provided: oxycodone 5 mg - taking more than 6 tablets per day. Be advised that we will prescribe narcotics short-term, for acute post-operative pain only - 1 week for minor operations such as knee arthroscopy for meniscus tear resection, and 3 weeks for major operations such as knee repair/reconstruction surgeries.   6. Bandages: The physical therapist should change the bandages at the first post-op appointment. If needed, the dressing supplies have been provided to you.   7. Physical Therapy: 2 times per week for the first 4 weeks, then 1-2 times per week from weeks 4-8 post-op. Therapy typically starts on post operative Day 3 or 4. You have been provided an order for physical therapy. The therapist will provide home exercises.   8. Work/School: May return when able to tolerate standing for greater than 2 hours and off of narcotic pain medications. Can  return to school usually in ~1-2 weeks.    9. Post-Op Appointments: Your first post-op appointment will be with Dr. Posey Pronto in approximately 2 weeks time.    If you find that they have not been scheduled please call the Orthopaedic Appointment front desk at [phone].        General  Anesthesia, Adult, Care After This sheet gives you information about how to care for yourself after your procedure. Your health care provider may also give you more specific instructions. If you have problems or questions, contact your health care provider. What can I expect after the procedure? After the procedure, the following side effects are common:  Pain or discomfort at the IV site.  Nausea.  Vomiting.  Sore throat.  Trouble concentrating.  Feeling cold or chills.  Weak or tired.  Sleepiness and fatigue.  Soreness and body aches. These side effects can affect parts of the body that were not involved in surgery. Follow these instructions at home:  For at least 24 hours after the procedure:  Have a responsible adult stay with you. It is important to have someone help care for you until you are awake and alert.  Rest as needed.  Do not: ? Participate in activities in which you could fall or become injured. ? Drive. ? Use heavy machinery. ? Drink alcohol. ? Take sleeping pills or medicines that cause drowsiness. ? Make important decisions or sign legal documents. ? Take care of children on your own. Eating and drinking  Follow any instructions from your health care provider about eating or drinking restrictions.  When you feel hungry, start by eating small amounts of foods that are soft and easy to digest (bland), such as toast. Gradually return to your regular diet.  Drink enough fluid to keep your urine pale yellow.  If you vomit, rehydrate by drinking water, juice, or clear broth. General instructions  If you have sleep apnea, surgery and certain medicines can increase your risk for breathing problems. Follow instructions from your health care provider about wearing your sleep device: ? Anytime you are sleeping, including during daytime naps. ? While taking prescription pain medicines, sleeping medicines, or medicines that make you drowsy.  Return to your  normal activities as told by your health care provider. Ask your health care provider what activities are safe for you.  Take over-the-counter and prescription medicines only as told by your health care provider.  If you smoke, do not smoke without supervision.  Keep all follow-up visits as told by your health care provider. This is important. Contact a health care provider if:  You have nausea or vomiting that does not get better with medicine.  You cannot eat or drink without vomiting.  You have pain that does not get better with medicine.  You are unable to pass urine.  You develop a skin rash.  You have a fever.  You have redness around your IV site that gets worse. Get help right away if:  You have difficulty breathing.  You have chest pain.  You have blood in your urine or stool, or you vomit blood. Summary  After the procedure, it is common to have a sore throat or nausea. It is also common to feel tired.  Have a responsible adult stay with you for the first 24 hours after general anesthesia. It is important to have someone help care for you until you are awake and alert.  When you feel hungry, start by eating small amounts of  foods that are soft and easy to digest (bland), such as toast. Gradually return to your regular diet.  Drink enough fluid to keep your urine pale yellow.  Return to your normal activities as told by your health care provider. Ask your health care provider what activities are safe for you. This information is not intended to replace advice given to you by your health care provider. Make sure you discuss any questions you have with your health care provider. Document Revised: 09/24/2017 Document Reviewed: 05/07/2017 Elsevier Patient Education  [place].

## 2019-11-10 NOTE — H&P (Signed)
Paper H&P to be scanned into permanent record. H&P reviewed. No significant changes noted.  

## 2019-11-10 NOTE — Anesthesia Procedure Notes (Signed)
Anesthesia Regional Block: Adductor canal block   Pre-Anesthetic Checklist: ,, timeout performed, Correct Patient, Correct Site, Correct Laterality, Correct Procedure, Correct Position, site marked, Risks and benefits discussed,  Surgical consent,  Pre-op evaluation,  At surgeon's request and post-op pain management  Laterality: Right  Prep: chloraprep       Needles:  Injection technique: Single-shot  Needle Type: Echogenic Needle     Needle Length: 9cm  Needle Gauge: 21     Additional Needles:   Procedures:,,,, ultrasound used (permanent image in chart),,,,  Narrative:  Injection made incrementally with aspirations every 5 mL.  Performed by: Personally  Anesthesiologist: Viet Kemmerer A, MD  Additional Notes: Functioning IV was confirmed and monitors applied. Ultrasound guidance: relevant anatomy identified, needle position confirmed, local anesthetic spread visualized around nerve(s)., vascular puncture avoided.  Image printed for medical record.  Negative aspiration and no paresthesias; incremental administration of local anesthetic. The patient tolerated the procedure well. Vitals signes recorded in RN notes.      

## 2019-11-13 ENCOUNTER — Encounter: Payer: Self-pay | Admitting: *Deleted

## 2020-03-05 DEATH — deceased

## 2021-06-30 IMAGING — MR MR KNEE*R* W/O CM
7 series · 40 of 40 positions shown · non-contrast
Comparison: None.

CLINICAL DATA: Chronic right knee pain. The patient reports no
recent injury.

EXAM:
MRI OF THE RIGHT KNEE WITHOUT CONTRAST
TECHNIQUE: Multiplanar, multisequence MR imaging of the knee was performed. No
intravenous contrast was administered.

[Series 15: T2 fat-sat · axial · right · 4.0mm · 0.50mm/px · z∈[-38,+87]mm · 6 of 26 slices shown (1 of 3)]
[im 1/26]
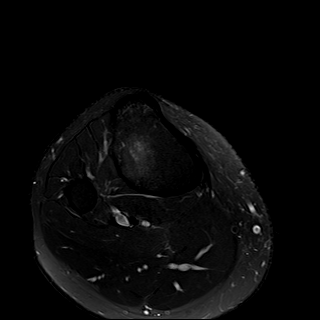
[im 6/26]
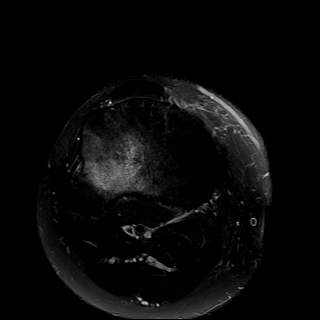
[im 11/26]
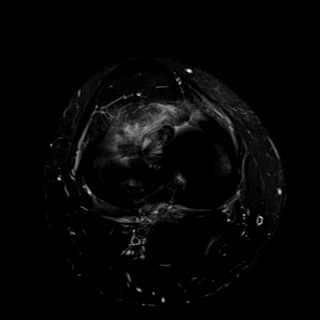
[im 16/26]
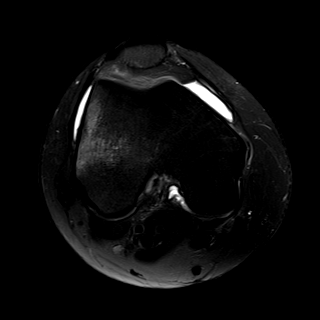
[im 21/26]
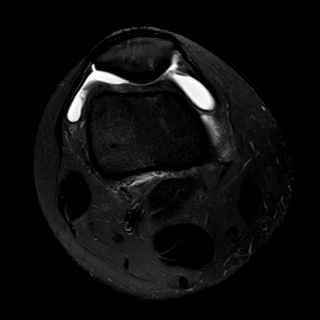
[im 26/26]
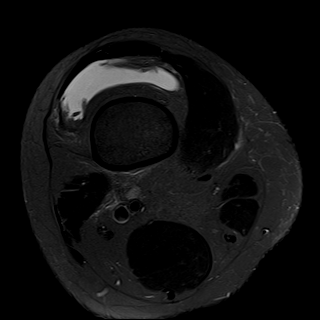

[Series 17: T2 fat-sat · coronal · right · 4.0mm · 0.59mm/px · 6 of 32 slices shown (2 of 3)]
[im 1/32]
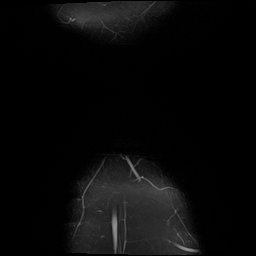
[im 7/32]
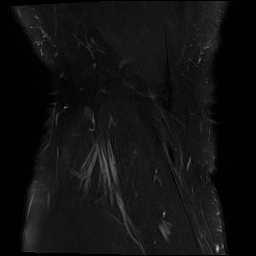
[im 13/32]
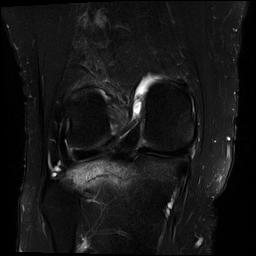
[im 19/32]
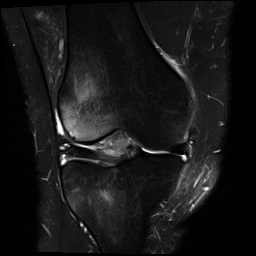
[im 25/32]
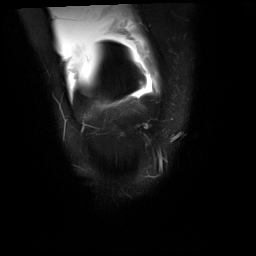
[im 32/32]
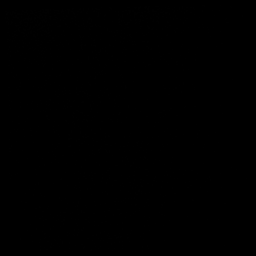

[Series 18: T1 · coronal · right · 4.0mm · 0.42mm/px · 6 of 32 slices shown]
[im 1/32]
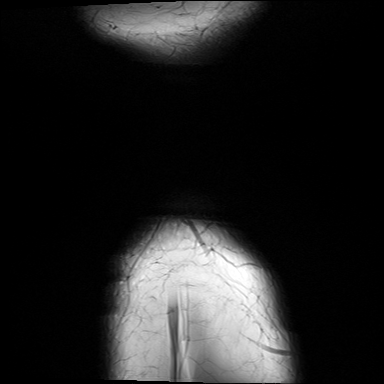
[im 7/32]
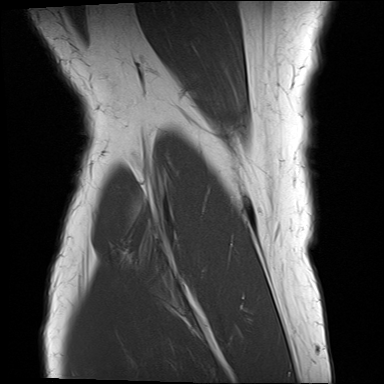
[im 13/32]
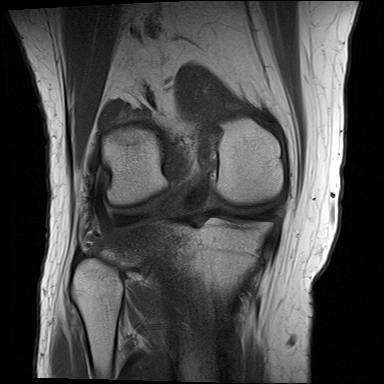
[im 19/32]
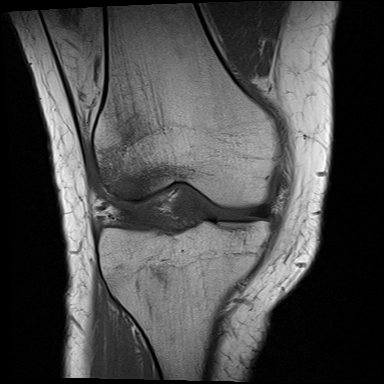
[im 25/32]
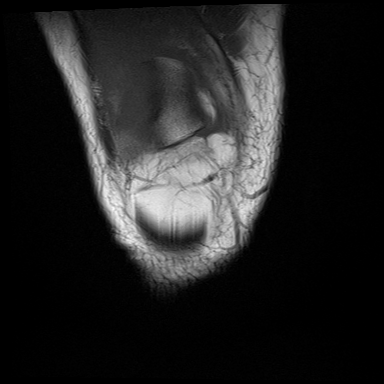
[im 32/32]
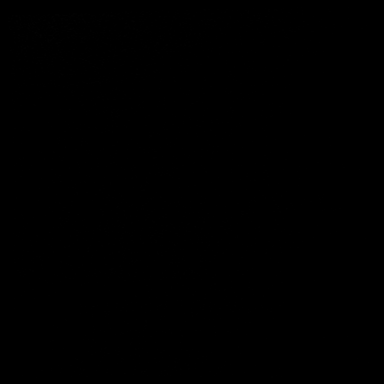

[Series 19: PD fat-sat · coronal · right · 4.0mm · 0.59mm/px · 6 of 31 slices shown (1 of 2)]
[im 1/31]
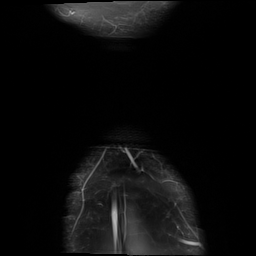
[im 7/31]
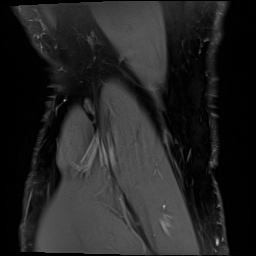
[im 13/31]
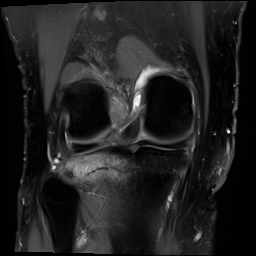
[im 19/31]
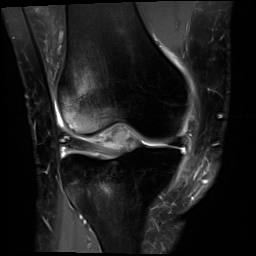
[im 25/31]
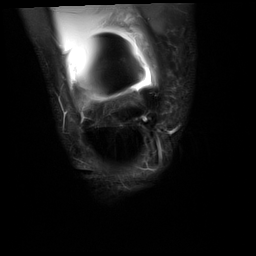
[im 31/31]
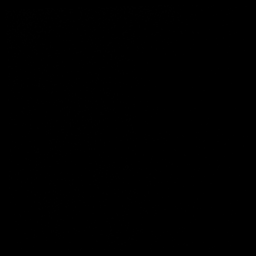

[Series 20: PD fat-sat · sagittal · right · 3.0mm · 0.59mm/px · 7 of 35 slices shown (2 of 2)]
[im 1/35]
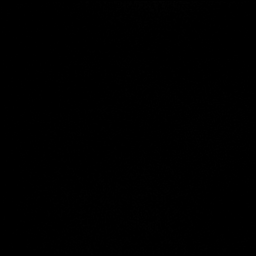
[im 6/35]
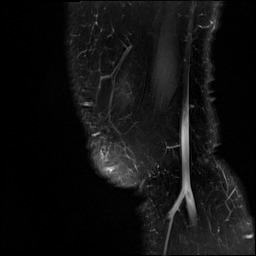
[im 12/35]
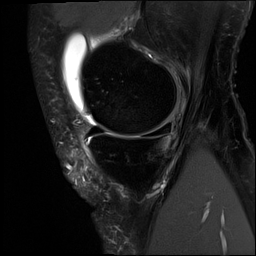
[im 18/35]
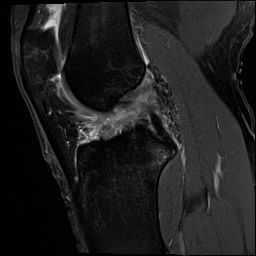
[im 23/35]
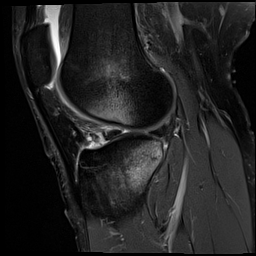
[im 29/35]
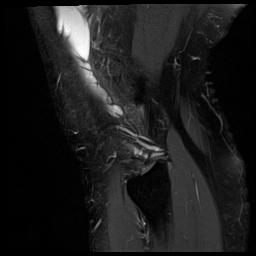
[im 35/35]
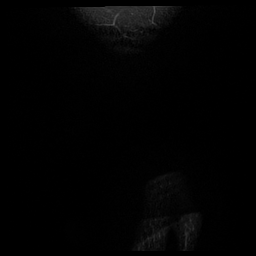

[Series 21: T2 fat-sat · sagittal · right · 3.0mm · 0.59mm/px · 7 of 36 slices shown (3 of 3)]
[im 1/36]
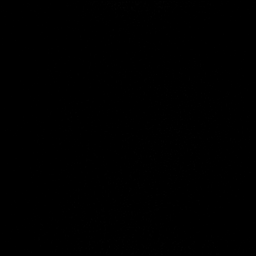
[im 6/36]
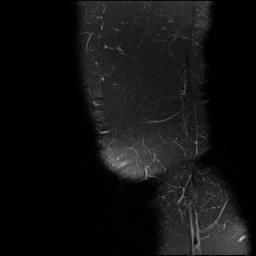
[im 12/36]
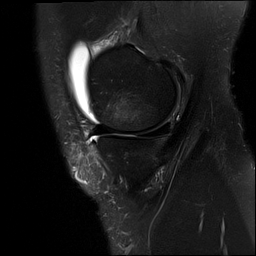
[im 18/36]
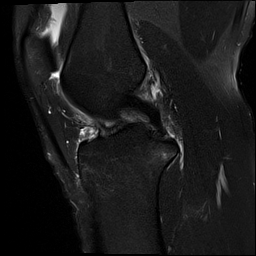
[im 24/36]
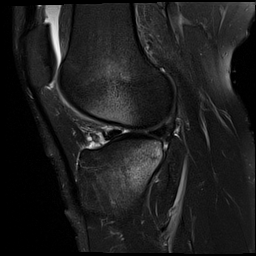
[im 30/36]
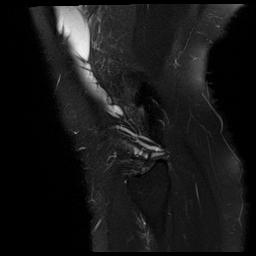
[im 36/36]
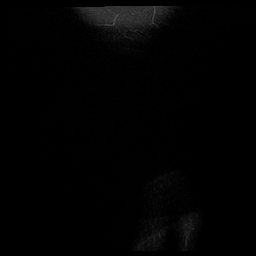

[Series 22: PD · oblique · right · 2.0mm · 0.47mm/px · 2 of 10 slices shown]
[im 1/10]
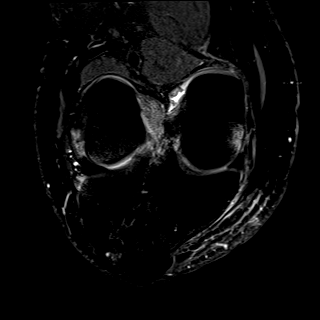
[im 10/10]
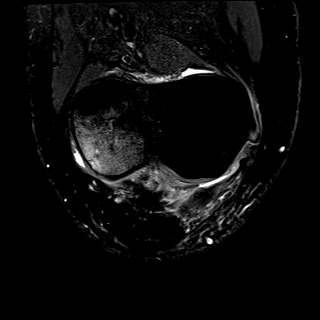

[40 of 40 positions shown; findings below may reference images not displayed]

FINDINGS: MENISCI

Medial meniscus:  Intact.

Lateral meniscus: A radial tear is seen along the free edge of the
body.

LIGAMENTS

Cruciates:  The ACL is completely torn.  The PCL is intact.

Collaterals:  Intact.

CARTILAGE

Patellofemoral:  Normal.

Medial:  Normal.

Lateral:  Normal.

Joint:  Moderate joint effusion.

Popliteal Fossa:  No Baker's cyst.

Extensor Mechanism:  Intact.

Bones: Bone contusions are seen about the knee and a mild impaction
fracture in the anterior weight-bearing lateral femoral condyle
consistent with a pivot-shift injury.

Other: None.
IMPRESSION: Acute ACL tear with a bone bruise pattern and minimal impaction
fracture of the anterior weight-bearing lateral femoral condyle
consistent with a pivot-shift injury.

Radial tear of the free edge of the body of the lateral meniscus.

## 2021-08-18 ENCOUNTER — Other Ambulatory Visit: Payer: Self-pay

## 2021-08-18 ENCOUNTER — Emergency Department
Admission: EM | Admit: 2021-08-18 | Discharge: 2021-08-18 | Disposition: A | Discharge status: Home / Self Care | Payer: BC Managed Care – PPO | Attending: Emergency Medicine | Admitting: Emergency Medicine

## 2021-08-18 DIAGNOSIS — Z91010 Allergy to peanuts: Secondary | ICD-10-CM | POA: Diagnosis not present

## 2021-08-18 DIAGNOSIS — R1084 Generalized abdominal pain: Secondary | ICD-10-CM | POA: Insufficient documentation

## 2021-08-18 DIAGNOSIS — Z8616 Personal history of COVID-19: Secondary | ICD-10-CM | POA: Diagnosis not present

## 2021-08-18 DIAGNOSIS — J45909 Unspecified asthma, uncomplicated: Secondary | ICD-10-CM | POA: Diagnosis not present

## 2021-08-18 DIAGNOSIS — R197 Diarrhea, unspecified: Secondary | ICD-10-CM | POA: Insufficient documentation

## 2021-08-18 DIAGNOSIS — Z87891 Personal history of nicotine dependence: Secondary | ICD-10-CM | POA: Diagnosis not present

## 2021-08-18 LAB — COMPREHENSIVE METABOLIC PANEL
ALT: 16 U/L (ref 0–44)
AST: 20 U/L (ref 15–41)
Albumin: 3.9 g/dL (ref 3.5–5.0)
Alkaline Phosphatase: 49 U/L (ref 38–126)
Anion gap: 10 (ref 5–15)
BUN: 11 mg/dL (ref 6–20)
CO2: 26 mmol/L (ref 22–32)
Calcium: 9.2 mg/dL (ref 8.9–10.3)
Chloride: 103 mmol/L (ref 98–111)
Creatinine, Ser: 1.11 mg/dL (ref 0.61–1.24)
GFR, Estimated: >60 mL/min (ref >60)
Glucose, Bld: 94 mg/dL (ref 70–99)
Potassium: 3.8 mmol/L (ref 3.5–5.1)
Sodium: 139 mmol/L (ref 135–145)
Total Bilirubin: 0.6 mg/dL (ref 0.3–1.2)
Total Protein: 8.2 g/dL — ABNORMAL HIGH (ref 6.5–8.1)

## 2021-08-18 LAB — CBC WITH DIFFERENTIAL/PLATELET
Abs Immature Granulocytes: 0.04 10*3/uL (ref 0.00–0.07)
Basophils Absolute: 0 10*3/uL (ref 0.0–0.1)
Basophils Relative: 0 %
Eosinophils Absolute: 0.2 10*3/uL (ref 0.0–0.5)
Eosinophils Relative: 1 %
HCT: 43.4 % (ref 39.0–52.0)
Hemoglobin: 14.1 g/dL (ref 13.0–17.0)
Immature Granulocytes: 0 %
Lymphocytes Relative: 13 %
Lymphs Abs: 1.5 10*3/uL (ref 0.7–4.0)
MCH: 28.7 pg (ref 26.0–34.0)
MCHC: 32.5 g/dL (ref 30.0–36.0)
MCV: 88.4 fL (ref 80.0–100.0)
Monocytes Absolute: 0.7 10*3/uL (ref 0.1–1.0)
Monocytes Relative: 6 %
Neutro Abs: 8.9 10*3/uL — ABNORMAL HIGH (ref 1.7–7.7)
Neutrophils Relative %: 80 %
Platelets: 307 10*3/uL (ref 150–400)
RBC: 4.91 MIL/uL (ref 4.22–5.81)
RDW: 11.9 % (ref 11.5–15.5)
WBC: 11.3 10*3/uL — ABNORMAL HIGH (ref 4.0–10.5)
nRBC: 0 % (ref 0.0–0.2)

## 2021-08-18 LAB — URINALYSIS, ROUTINE W REFLEX MICROSCOPIC
Bacteria, UA: NONE SEEN
Bilirubin Urine: NEGATIVE
Glucose, UA: NEGATIVE mg/dL
Hgb urine dipstick: NEGATIVE
Ketones, ur: 20 mg/dL — AB
Leukocytes,Ua: NEGATIVE
Nitrite: NEGATIVE
Protein, ur: 30 mg/dL — AB
Specific Gravity, Urine: 1.033 — ABNORMAL HIGH (ref 1.005–1.030)
pH: 5 (ref 5.0–8.0)

## 2021-08-18 MED ORDER — ONDANSETRON 4 MG PO TBDP: 4.0000 mg | ORAL_TABLET | Freq: Three times a day (TID) | ORAL | 0 refills | Status: AC | PRN

## 2021-08-18 MED ORDER — ONDANSETRON 4 MG PO TBDP
4.0000 mg | ORAL_TABLET | Freq: Once | ORAL | Status: AC
  Administered 2021-08-18: 4 mg via ORAL
  Filled 2021-08-18: qty 1

## 2021-08-18 NOTE — ED Provider Notes (Signed)
Emergency Medicine Provider Triage Evaluation Note  Cody Boone , a 25 y.o. male  was evaluated in triage.  Pt complains of mid abdominal pain started yesterday, diarrhea, no vomiting, no fever.  Review of Systems  Positive: Abdominal pain, diarrhea Negative: Vomiting, fever, chills, dysuria  Physical Exam  There were no vitals taken for this visit. Gen:   Awake, no distress   Resp:  Normal effort  MSK:   Moves extremities without difficulty  Other:    Medical Decision Making  Medically screening exam initiated at 3:04 PM.  Appropriate orders placed.  SULEYMAN EHRMAN was informed that the remainder of the evaluation will be completed by another provider, this initial triage assessment does not replace that evaluation, and the importance of remaining in the ED until their evaluation is complete.     Faythe Ghee, PA-C 08/18/21 1504    Gilles Chiquito, MD 08/18/21 1539

## 2021-08-18 NOTE — ED Triage Notes (Signed)
Pt comes with c/o abdominal pain since yesterday. Pt states some diarrhea. No vomiting.  Pt states mid lower belly pain.

## 2021-08-18 NOTE — Discharge Instructions (Signed)

## 2021-08-18 NOTE — ED Provider Notes (Signed)
[facility] Emergency Department Provider Note    Event Date/Time   First MD Initiated Contact with Patient 08/18/21 1801     (approximate)  I have reviewed the triage vital Boone and the nursing notes.   HISTORY  Chief Complaint Abdominal Pain    HPI Cody Boone is a 25 y.o. male with below listed past medical history presents to the ER for several days of crampy generalized abdominal pain associated with some loose nonbloody nonmelanotic diarrheal stools.  Did eat some sheets as well as [employer] over the weekend.  No one else around him is sick with similar symptoms.  He is denying any pain right now states that intermittent, crampy in nature.  Has not really had much to eat today due to fear of causing some crampy pain.  Denies any chest pain cough or congestion.  Past Medical History:  Diagnosis Date   Asthma    COVID-19 10/20/2019   symptoms resolved 10/25/19   No family history on file. Past Surgical History:  Procedure Laterality Date   KNEE ARTHROSCOPY WITH ANTERIOR CRUCIATE LIGAMENT (ACL) REPAIR Right 11/10/2019   Procedure: KNEE ARTHROSCOPY WITH ANTERIOR CRUCIATE LIGAMENT (ACL) REPAIR USING QUADRICEPS TENDON AUTOGRAFT, LATERAL MENISCUS REPAIR;  Surgeon: Leim Fabry, MD;  Location: [facility];  Service: Orthopedics;  Laterality: Right;  ADDUCTOR CANAL NERVE BLOCK   OPEN REDUCTION INTERNAL FIXATION (ORIF) PROXIMAL PHALANX Right 03/03/2018   Procedure: OPEN REDUCTION INTERNAL FIXATION (ORIF) PROXIMAL PHALANX-RIGHT RING FINGER;  Surgeon: Hessie Knows, MD;  Location: [facility];  Service: Orthopedics;  Laterality: Right;   ORBITAL FRACTURE SURGERY Left    There are no problems to display for this patient.     Prior to Admission medications   Medication Sig Start Date End Date Taking? Authorizing Provider  ondansetron (ZOFRAN ODT) 4 MG disintegrating tablet Take 1 tablet (4 mg total) by mouth every 8 (eight) hours as needed for  nausea or vomiting. 08/18/21  Yes Merlyn Lot, MD  HYDROcodone-acetaminophen (NORCO) 5-325 MG tablet Take 1-2 tablets by mouth every 6 (six) hours as needed for moderate pain. Patient taking differently: Take 1 tablet by mouth every 6 (six) hours as needed for moderate pain.  03/03/18   Hessie Knows, MD  ondansetron (ZOFRAN ODT) 4 MG disintegrating tablet Take 1 tablet (4 mg total) by mouth every 8 (eight) hours as needed for nausea or vomiting. 11/10/19   Leim Fabry, MD    Allergies Peanut-containing drug products, Shellfish-derived products, and Amoxicillin    Social History Social History   Tobacco Use   Smoking status: Former    Packs/day: 0.25    Years: 0.50    Pack years: 0.13    Types: Cigars, Cigarettes    Quit date: 11/01/2017    Years since quitting: 3.7   Smokeless tobacco: Never  Vaping Use   Vaping Use: Never used  Substance Use Topics   Alcohol use: Not Currently    Alcohol/week: 0.0 standard drinks    Comment: maybe once a month   Drug use: Never    Review of Systems Patient denies headaches, rhinorrhea, blurry vision, numbness, shortness of breath, chest pain, edema, cough, abdominal pain, nausea, vomiting, diarrhea, dysuria, fevers, rashes or hallucinations unless otherwise stated above in HPI. ____________________________________________   PHYSICAL EXAM:  VITAL Boone: Vitals:   08/18/21 1506 08/18/21 1845  BP: (!) 154/96 (!) 146/87  Pulse: 63 62  Resp: 18 17  Temp: 98 F (36.7 C)   SpO2: 100% 100%  Constitutional: Alert and oriented.  Eyes: Conjunctivae are normal.  Head: Atraumatic. Nose: No congestion/rhinnorhea. Mouth/Throat: Mucous membranes are moist.   Neck: No stridor. Painless ROM.  Cardiovascular: Normal rate, regular rhythm. Grossly normal heart sounds.  Good peripheral circulation. Respiratory: Normal respiratory effort.  No retractions. Lungs CTAB. Gastrointestinal: Soft and nontender in all four quadrants. No distention.  No abdominal bruits. No CVA tenderness. Genitourinary:  Musculoskeletal: No lower extremity tenderness nor edema.  No joint effusions. Neurologic:  Normal speech and language. No gross focal neurologic deficits are appreciated. No facial droop Skin:  Skin is warm, dry and intact. No rash noted. Psychiatric: Mood and affect are normal. Speech and behavior are normal.  ____________________________________________   LABS (all labs ordered are listed, but only abnormal results are displayed)  Results for orders placed or performed during the hospital encounter of 08/18/21 (from the past 24 hour(s))  Urinalysis, Routine w reflex microscopic     Status: Abnormal   Collection Time: 08/18/21  3:05 PM  Result Value Ref Range   Color, Urine YELLOW (A) YELLOW   APPearance HAZY (A) CLEAR   Specific Gravity, Urine 1.033 (H) 1.005 - 1.030   pH 5.0 5.0 - 8.0   Glucose, UA NEGATIVE NEGATIVE mg/dL   Hgb urine dipstick NEGATIVE NEGATIVE   Bilirubin Urine NEGATIVE NEGATIVE   Ketones, ur 20 (A) NEGATIVE mg/dL   Protein, ur 30 (A) NEGATIVE mg/dL   Nitrite NEGATIVE NEGATIVE   Leukocytes,Ua NEGATIVE NEGATIVE   RBC / HPF 0-5 0 - 5 RBC/hpf   WBC, UA 0-5 0 - 5 WBC/hpf   Bacteria, UA NONE SEEN NONE SEEN   Squamous Epithelial / LPF 0-5 0 - 5   Mucus PRESENT   Comprehensive metabolic panel     Status: Abnormal   Collection Time: 08/18/21  3:06 PM  Result Value Ref Range   Sodium 139 135 - 145 mmol/L   Potassium 3.8 3.5 - 5.1 mmol/L   Chloride 103 98 - 111 mmol/L   CO2 26 22 - 32 mmol/L   Glucose, Bld 94 70 - 99 mg/dL   BUN 11 6 - 20 mg/dL   Creatinine, Ser 1.11 0.61 - 1.24 mg/dL   Calcium 9.2 8.9 - 10.3 mg/dL   Total Protein 8.2 (H) 6.5 - 8.1 g/dL   Albumin 3.9 3.5 - 5.0 g/dL   AST 20 15 - 41 U/L   ALT 16 0 - 44 U/L   Alkaline Phosphatase 49 38 - 126 U/L   Total Bilirubin 0.6 0.3 - 1.2 mg/dL   GFR, Estimated >60 >60 mL/min   Anion gap 10 5 - 15  CBC with Differential     Status: Abnormal    Collection Time: 08/18/21  3:06 PM  Result Value Ref Range   WBC 11.3 (H) 4.0 - 10.5 K/uL   RBC 4.91 4.22 - 5.81 MIL/uL   Hemoglobin 14.1 13.0 - 17.0 g/dL   HCT 43.4 39.0 - 52.0 %   MCV 88.4 80.0 - 100.0 fL   MCH 28.7 26.0 - 34.0 pg   MCHC 32.5 30.0 - 36.0 g/dL   RDW 11.9 11.5 - 15.5 %   Platelets 307 150 - 400 K/uL   nRBC 0.0 0.0 - 0.2 %   Neutrophils Relative % 80 %   Neutro Abs 8.9 (H) 1.7 - 7.7 K/uL   Lymphocytes Relative 13 %   Lymphs Abs 1.5 0.7 - 4.0 K/uL   Monocytes Relative 6 %   Monocytes Absolute 0.7 0.1 -  1.0 K/uL   Eosinophils Relative 1 %   Eosinophils Absolute 0.2 0.0 - 0.5 K/uL   Basophils Relative 0 %   Basophils Absolute 0.0 0.0 - 0.1 K/uL   Immature Granulocytes 0 %   Abs Immature Granulocytes 0.04 0.00 - 0.07 K/uL   ____________________________________________ ____________________________________________  RADIOLOGY   ____________________________________________   PROCEDURES  Procedure(s) performed:  Procedures    Critical Care performed: no ____________________________________________   INITIAL IMPRESSION / ASSESSMENT AND PLAN / ED COURSE  Pertinent labs & imaging results that were available during my care of the patient were reviewed by me and considered in my medical decision making (see chart for details).   DDX: Enteritis, colitis, foodborne illness, dehydration, electrolyte abnormality, appendicitis, cholelithiasis, diverticulitis, IBD  Cody Boone is a 25 y.o. who presents to the ED with presentation as described above.  Patient clinically very well-appearing in no acute distress with benign abdominal exam.  Is a borderline leukocytosis but would expect higher numbers as well as general worse appearance if he were to have pathology such as appendicitis or biliary pathology.  Is pain-free right now I do not believe that CT imaging is clinically indicated.  I suspect some sort of enteritis.  Will trial Zofran and p.o.  challenge.  Clinical Course as of 08/18/21 1901  Mon Aug 18, 2021  1900 Patient reassessed.  He is tolerating p.o.  His abdominal exam is soft and benign.  Does appear stable and appropriate for outpatient follow-up.  We discussed strict return precautions.  Patient agreeable to plan. [PR]    Clinical Course User Index [PR] Willy Eddy, MD    The patient was evaluated in Emergency Department today for the symptoms described in the history of present illness. He/she was evaluated in the context of the global COVID-19 pandemic, which necessitated consideration that the patient might be at risk for infection with the SARS-CoV-2 virus that causes COVID-19. Institutional protocols and algorithms that pertain to the evaluation of patients at risk for COVID-19 are in a state of rapid change based on information released by regulatory bodies including the CDC and federal and state organizations. These policies and algorithms were followed during the patient's care in the ED.  As part of my medical decision making, I reviewed the following data within the electronic MEDICAL RECORD NUMBER Nursing notes reviewed and incorporated, Labs reviewed, notes from prior ED visits and [place] Controlled Substance Database   ____________________________________________   FINAL CLINICAL IMPRESSION(S) / ED DIAGNOSES  Final diagnoses:  Generalized abdominal pain  Diarrhea, unspecified type      NEW MEDICATIONS STARTED DURING THIS VISIT:  New Prescriptions   ONDANSETRON (ZOFRAN ODT) 4 MG DISINTEGRATING TABLET    Take 1 tablet (4 mg total) by mouth every 8 (eight) hours as needed for nausea or vomiting.     Note:  This document was prepared using Dragon voice recognition software and may include unintentional dictation errors.    Willy Eddy, MD 08/18/21 1901
